# Patient Record
Sex: Female | Born: 1990
Health system: Southern US, Community
[De-identification: ages and names within clinical notes are randomized; demographics above are authoritative.]

## PROBLEM LIST (undated history)

## (undated) DIAGNOSIS — E785 Hyperlipidemia, unspecified: Secondary | ICD-10-CM

## (undated) DIAGNOSIS — I10 Essential (primary) hypertension: Secondary | ICD-10-CM

## (undated) DIAGNOSIS — N760 Acute vaginitis: Secondary | ICD-10-CM

## (undated) DIAGNOSIS — Z3403 Encounter for supervision of normal first pregnancy, third trimester: Secondary | ICD-10-CM

## (undated) DIAGNOSIS — T7840XA Allergy, unspecified, initial encounter: Secondary | ICD-10-CM

## (undated) DIAGNOSIS — O24419 Gestational diabetes mellitus in pregnancy, unspecified control: Secondary | ICD-10-CM

## (undated) DIAGNOSIS — E349 Endocrine disorder, unspecified: Secondary | ICD-10-CM

## (undated) DIAGNOSIS — B9689 Other specified bacterial agents as the cause of diseases classified elsewhere: Secondary | ICD-10-CM

## (undated) DIAGNOSIS — B999 Unspecified infectious disease: Secondary | ICD-10-CM

## (undated) HISTORY — DX: Other specified bacterial agents as the cause of diseases classified elsewhere: B96.89

## (undated) HISTORY — DX: Allergy, unspecified, initial encounter: T78.40XA

## (undated) HISTORY — DX: Other specified bacterial agents as the cause of diseases classified elsewhere: N76.0

## (undated) HISTORY — DX: Hyperlipidemia, unspecified: E78.5

## (undated) HISTORY — DX: Essential (primary) hypertension: I10

## (undated) HISTORY — DX: Unspecified infectious disease: B99.9

## (undated) HISTORY — DX: Gestational diabetes mellitus in pregnancy, unspecified control: O24.419

## (undated) HISTORY — PX: TUBAL LIGATION: SHX77

## (undated) HISTORY — PX: NO PAST SURGERIES: SHX2092

---

## 2013-07-29 NOTE — L&D Delivery Note (Signed)
Delivery Note At 11:01 PM a viable and healthy female was delivered via Vaginal, Spontaneous Delivery (Presentation: OA, LOT  ).  APGAR: 4,P ; weight  P.   Placenta status: delivered, intact .  Cord: 3VC  with the following complications: none.   Maternal fever in labor, treated with ampicillin ans gentamicin.  NICU called for code apgar, responded very quickly - baby to NICU for antibiotics.  Anesthesia:  epidural Episiotomy:  none Lacerations:  2nd degree perineal Suture Repair: 3.0 vicryl rapide Est. Blood Loss (mL): 400cc   Mom to postpartum.  Baby to Couplet care / Skin to Skin.  Edwards, Gabrielle Westra 07/17/2014, 11:27 PM  O+/RI/Tdap in PNC/Br/ contra?

## 2014-03-31 LAB — OB RESULTS CONSOLE ABO/RH: RH Type: POSITIVE

## 2014-03-31 LAB — OB RESULTS CONSOLE RPR: RPR: NONREACTIVE

## 2014-03-31 LAB — OB RESULTS CONSOLE HEPATITIS B SURFACE ANTIGEN: Hepatitis B Surface Ag: NEGATIVE

## 2014-03-31 LAB — OB RESULTS CONSOLE RUBELLA ANTIBODY, IGM: Rubella: IMMUNE

## 2014-03-31 LAB — OB RESULTS CONSOLE ANTIBODY SCREEN: Antibody Screen: NEGATIVE

## 2014-03-31 LAB — OB RESULTS CONSOLE HIV ANTIBODY (ROUTINE TESTING): HIV: NONREACTIVE

## 2014-06-15 LAB — OB RESULTS CONSOLE GC/CHLAMYDIA
Chlamydia: NEGATIVE
GC PROBE AMP, GENITAL: NEGATIVE

## 2014-06-15 LAB — OB RESULTS CONSOLE GBS: GBS: NEGATIVE

## 2014-07-16 ENCOUNTER — Encounter (HOSPITAL_COMMUNITY): Payer: Self-pay

## 2014-07-16 ENCOUNTER — Inpatient Hospital Stay (HOSPITAL_COMMUNITY)
Admission: RE | Admit: 2014-07-16 | Discharge: 2014-07-19 | DRG: 775 | Disposition: A | Discharge status: Home / Self Care | Payer: 59 | Source: Ambulatory Visit | Attending: Obstetrics and Gynecology | Admitting: Obstetrics and Gynecology

## 2014-07-16 VITALS — BP 131/87 | HR 81 | Temp 98.0°F | Resp 18 | Ht 60.0 in | Wt 186.0 lb

## 2014-07-16 DIAGNOSIS — O411230 Chorioamnionitis, third trimester, not applicable or unspecified: Principal | ICD-10-CM | POA: Diagnosis present

## 2014-07-16 DIAGNOSIS — O480 Post-term pregnancy: Secondary | ICD-10-CM | POA: Diagnosis present

## 2014-07-16 DIAGNOSIS — Z3483 Encounter for supervision of other normal pregnancy, third trimester: Secondary | ICD-10-CM | POA: Diagnosis present

## 2014-07-16 DIAGNOSIS — Z8759 Personal history of other complications of pregnancy, childbirth and the puerperium: Secondary | ICD-10-CM

## 2014-07-16 DIAGNOSIS — Z3A40 40 weeks gestation of pregnancy: Secondary | ICD-10-CM | POA: Diagnosis present

## 2014-07-16 DIAGNOSIS — Z3403 Encounter for supervision of normal first pregnancy, third trimester: Secondary | ICD-10-CM

## 2014-07-16 HISTORY — DX: Encounter for supervision of normal first pregnancy, third trimester: Z34.03

## 2014-07-16 HISTORY — DX: Endocrine disorder, unspecified: E34.9

## 2014-07-16 LAB — TYPE AND SCREEN
ABO/RH(D): O POS
Antibody Screen: NEGATIVE

## 2014-07-16 LAB — CBC
HCT: 38.1 % (ref 36.0–46.0)
Hemoglobin: 12.8 g/dL (ref 12.0–15.0)
MCH: 29.3 pg (ref 26.0–34.0)
MCHC: 33.6 g/dL (ref 30.0–36.0)
MCV: 87.2 fL (ref 78.0–100.0)
Platelets: 139 10*3/uL — ABNORMAL LOW (ref 150–400)
RBC: 4.37 MIL/uL (ref 3.87–5.11)
RDW: 15.1 % (ref 11.5–15.5)
WBC: 9.6 10*3/uL (ref 4.0–10.5)

## 2014-07-16 MED ORDER — LACTATED RINGERS IV SOLN
INTRAVENOUS | Status: DC
  Administered 2014-07-16 – 2014-07-17 (×5): via INTRAVENOUS

## 2014-07-16 MED ORDER — TERBUTALINE SULFATE 1 MG/ML IJ SOLN: 0.2500 mg | Freq: Once | INTRAMUSCULAR | Status: AC | PRN

## 2014-07-16 MED ORDER — OXYTOCIN BOLUS FROM INFUSION: 500.0000 mL | INTRAVENOUS | Status: DC

## 2014-07-16 MED ORDER — OXYCODONE-ACETAMINOPHEN 5-325 MG PO TABS: 2.0000 | ORAL_TABLET | ORAL | Status: DC | PRN

## 2014-07-16 MED ORDER — MISOPROSTOL 25 MCG QUARTER TABLET
25.0000 ug | ORAL_TABLET | ORAL | Status: DC | PRN
  Administered 2014-07-16: 25 ug via VAGINAL
  Filled 2014-07-16 (×3): qty 0.25
  Filled 2014-07-16: qty 1
  Filled 2014-07-16: qty 0.25

## 2014-07-16 MED ORDER — MISOPROSTOL 50MCG HALF TABLET
50.0000 ug | ORAL_TABLET | Freq: Four times a day (QID) | ORAL | Status: DC
  Administered 2014-07-17 (×2): 50 ug via ORAL
  Filled 2014-07-16 (×7): qty 1

## 2014-07-16 MED ORDER — LIDOCAINE HCL (PF) 1 % IJ SOLN
30.0000 mL | INTRAMUSCULAR | Status: DC | PRN
  Filled 2014-07-16: qty 30

## 2014-07-16 MED ORDER — OXYTOCIN 40 UNITS IN LACTATED RINGERS INFUSION - SIMPLE MED
1.0000 m[IU]/min | INTRAVENOUS | Status: DC
  Administered 2014-07-17: 6 m[IU]/min via INTRAVENOUS
  Administered 2014-07-17: 2 m[IU]/min via INTRAVENOUS
  Filled 2014-07-16: qty 1000

## 2014-07-16 MED ORDER — ONDANSETRON HCL 4 MG/2ML IJ SOLN: 4.0000 mg | Freq: Four times a day (QID) | INTRAMUSCULAR | Status: DC | PRN

## 2014-07-16 MED ORDER — OXYCODONE-ACETAMINOPHEN 5-325 MG PO TABS: 1.0000 | ORAL_TABLET | ORAL | Status: DC | PRN

## 2014-07-16 MED ORDER — CITRIC ACID-SODIUM CITRATE 334-500 MG/5ML PO SOLN: 30.0000 mL | ORAL | Status: DC | PRN

## 2014-07-16 MED ORDER — OXYTOCIN 40 UNITS IN LACTATED RINGERS INFUSION - SIMPLE MED: 62.5000 mL/h | INTRAVENOUS | Status: DC

## 2014-07-16 MED ORDER — LACTATED RINGERS IV SOLN
500.0000 mL | INTRAVENOUS | Status: DC | PRN
  Administered 2014-07-17: 1000 mL via INTRAVENOUS

## 2014-07-16 MED ORDER — ACETAMINOPHEN 325 MG PO TABS
650.0000 mg | ORAL_TABLET | ORAL | Status: DC | PRN
  Administered 2014-07-17: 650 mg via ORAL
  Filled 2014-07-16: qty 2

## 2014-07-16 NOTE — H&P (Signed)
Gabrielle Edwards is a 23 y.o. female G1P0 late entry to care, at 6340+ for IOL.  +FM, no LOF, no VB, occ ctx.  Relatively uncomplicated PNC, except late entry to care.  Chl in pregnancy, TOC neg.  Abnormal pap smear - will follow up in one year.    Maternal Medical History:  Contractions: Frequency: irregular.    Fetal activity: Perceived fetal activity is normal.    Prenatal Complications - Diabetes: none.    OB History    No data available    G1 present - late entry to care + abn pap, ASCUS HR HPV Chl in pregnancy  Past Medical History  Diagnosis Date  . Normal first pregnancy confirmed, currently in third trimester 07/16/2014   No past surgical history on file. 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   HENT: Negative.   Eyes: Negative.   Respiratory: Negative.   Cardiovascular: Negative.   Gastrointestinal: Negative.   Genitourinary: Negative.   Musculoskeletal: Negative.   Skin: Negative.   Neurological: Negative.   Psychiatric/Behavioral: Negative.       There were no vitals taken for this visit. Maternal Exam:  Abdomen: Fundal height is appropriate for gestation.   Estimated fetal weight is 7#.   Fetal presentation: vertex  Introitus: Normal vulva. Normal vagina.  Cervix: Cervix evaluated by digital exam.     Physical Exam  Constitutional: She is oriented to person, place, and time. She appears well-developed and well-nourished.  HENT:  Head: Normocephalic and atraumatic.   Cardiovascular: Normal rate and regular rhythm.   Respiratory: Effort normal and breath sounds normal. No respiratory distress. She has no wheezes.  GI: Soft. Bowel sounds are normal. She exhibits no distension. There is no tenderness.  Musculoskeletal: Normal range of motion.  Neurological: She is alert and oriented to person, place, and time.  Skin: Skin is warm and dry.  Psychiatric: She has a normal mood and affect. Her behavior is normal.    Prenatal labs: ABO, Rh:  O+ Antibody:  neg Rubella:  immune RPR:   NR HBsAg:   neg HIV:   neg GBS:   neg  Hgb 10.7/Plt 255K/ glucola 139, 3hr GTT 1 elevated, repeat 3hr WNL/Chl + TOC neg LMP cw 2nd tri US - cwd, nl anat, post plac, female   Tdap 04/14/14  Assessment/Plan: 23yo G1P0 at 40+for IOL cytotec O/N, then pitocin  gbbs neg Epidural prn   Gabrielle Edwards, Gabrielle Edwards 07/16/2014, 4:26 PM

## 2014-07-17 ENCOUNTER — Inpatient Hospital Stay (HOSPITAL_COMMUNITY): Payer: 59 | Admitting: Anesthesiology

## 2014-07-17 ENCOUNTER — Encounter (HOSPITAL_COMMUNITY): Payer: Self-pay

## 2014-07-17 LAB — CBC
HCT: 40 % (ref 36.0–46.0)
Hemoglobin: 13.7 g/dL (ref 12.0–15.0)
MCH: 30 pg (ref 26.0–34.0)
MCHC: 34.3 g/dL (ref 30.0–36.0)
MCV: 87.5 fL (ref 78.0–100.0)
PLATELETS: 132 10*3/uL — AB (ref 150–400)
RBC: 4.57 MIL/uL (ref 3.87–5.11)
RDW: 15.1 % (ref 11.5–15.5)
WBC: 13.2 10*3/uL — ABNORMAL HIGH (ref 4.0–10.5)

## 2014-07-17 LAB — ABO/RH: ABO/RH(D): O POS

## 2014-07-17 LAB — RPR

## 2014-07-17 MED ORDER — FENTANYL 2.5 MCG/ML BUPIVACAINE 1/10 % EPIDURAL INFUSION (WH - ANES)
14.0000 mL/h | INTRAMUSCULAR | Status: DC | PRN
  Administered 2014-07-17 (×2): 14 mL/h via EPIDURAL
  Filled 2014-07-17 (×2): qty 125

## 2014-07-17 MED ORDER — PROMETHAZINE HCL 25 MG/ML IJ SOLN
25.0000 mg | Freq: Four times a day (QID) | INTRAMUSCULAR | Status: DC | PRN
  Administered 2014-07-17: 25 mg via INTRAVENOUS
  Filled 2014-07-17: qty 1

## 2014-07-17 MED ORDER — LIDOCAINE HCL (PF) 1 % IJ SOLN
INTRAMUSCULAR | Status: DC | PRN
  Administered 2014-07-17 (×2): 5 mL

## 2014-07-17 MED ORDER — EPHEDRINE 5 MG/ML INJ
10.0000 mg | INTRAVENOUS | Status: DC | PRN
  Filled 2014-07-17: qty 2

## 2014-07-17 MED ORDER — DIPHENHYDRAMINE HCL 50 MG/ML IJ SOLN: 12.5000 mg | INTRAMUSCULAR | Status: DC | PRN

## 2014-07-17 MED ORDER — DEXTROSE 5 % IV SOLN
120.0000 mg | Freq: Three times a day (TID) | INTRAVENOUS | Status: DC
  Administered 2014-07-17: 120 mg via INTRAVENOUS
  Filled 2014-07-17 (×3): qty 3

## 2014-07-17 MED ORDER — BUTORPHANOL TARTRATE 1 MG/ML IJ SOLN
2.0000 mg | INTRAMUSCULAR | Status: DC | PRN
  Administered 2014-07-17 (×3): 2 mg via INTRAVENOUS
  Filled 2014-07-17 (×3): qty 2

## 2014-07-17 MED ORDER — SODIUM CHLORIDE 0.9 % IV SOLN
2.0000 g | Freq: Four times a day (QID) | INTRAVENOUS | Status: DC
  Administered 2014-07-17: 2 g via INTRAVENOUS
  Filled 2014-07-17 (×4): qty 2000

## 2014-07-17 MED ORDER — PHENYLEPHRINE 40 MCG/ML (10ML) SYRINGE FOR IV PUSH (FOR BLOOD PRESSURE SUPPORT)
80.0000 ug | PREFILLED_SYRINGE | INTRAVENOUS | Status: DC | PRN
  Filled 2014-07-17: qty 10
  Filled 2014-07-17: qty 2

## 2014-07-17 MED ORDER — PHENYLEPHRINE 40 MCG/ML (10ML) SYRINGE FOR IV PUSH (FOR BLOOD PRESSURE SUPPORT)
80.0000 ug | PREFILLED_SYRINGE | INTRAVENOUS | Status: DC | PRN
  Filled 2014-07-17: qty 2

## 2014-07-17 MED ORDER — LACTATED RINGERS IV SOLN: 500.0000 mL | Freq: Once | INTRAVENOUS | Status: DC

## 2014-07-17 NOTE — Anesthesia Procedure Notes (Signed)
Epidural Patient location during procedure: OB Start time: 07/17/2014 11:37 AM  Staffing Anesthesiologist: Brayton CavesJACKSON, Banyan Goodchild Performed by: anesthesiologist   Preanesthetic Checklist Completed: patient identified, site marked, surgical consent, pre-op evaluation, timeout performed, IV checked, risks and benefits discussed and monitors and equipment checked  Epidural Patient position: sitting Prep: site prepped and draped and DuraPrep Patient monitoring: continuous pulse ox and blood pressure Approach: midline Location: L3-L4 Injection technique: LOR air  Needle:  Needle type: Tuohy  Needle gauge: 17 G Needle length: 9 cm and 9 Needle insertion depth: 7 cm Catheter type: closed end flexible Catheter size: 19 Gauge Catheter at skin depth: 12 cm Test dose: negative  Assessment Events: blood not aspirated, injection not painful, no injection resistance, negative IV test and no paresthesia  Additional Notes Patient identified.  Risk benefits discussed including failed block, incomplete pain control, headache, nerve damage, paralysis, blood pressure changes, nausea, vomiting, reactions to medication both toxic or allergic, and postpartum back pain.  Patient expressed understanding and wished to proceed.  All questions were answered.  Sterile technique used throughout procedure and epidural site dressed with sterile barrier dressing. No paresthesia or other complications noted.The patient did not experience any signs of intravascular injection such as tinnitus or metallic taste in mouth nor signs of intrathecal spread such as rapid motor block. Please see nursing notes for vital signs.

## 2014-07-17 NOTE — Progress Notes (Signed)
Patient ID: Gabrielle Edwards, female   DOB: 1990/11/03, 23 y.o.   MRN: [id number]   Comfortable with epidural  AFVSS gen NAD FHTs 150's, good var, category 1 toco 1-3 min  7/90/0  Continue IOL - pitocin

## 2014-07-17 NOTE — Progress Notes (Signed)
Patient ID: Gabrielle Edwards, female   DOB: [birth year], 23 y.o.   MRN: [id number]  Comfortable with stadol.    AFVSS gen NAD FHTs 130's good var, category 1 toco q 1-4 min  4/90/0 Attempt at AROM, w/o diff/comp  Contibue IOL

## 2014-07-17 NOTE — Progress Notes (Signed)
ANTIBIOTIC CONSULT NOTE - INITIAL  Pharmacy Consult for Gentamicin Indication: Chorioamnionitis   No Known Allergies  Patient Measurements: Height: 5' (152.4 cm) Weight: 186 lb (84.369 kg) IBW/kg (Calculated) : 45.5 Adjusted Body Weight: 57.2 kg Dosing Weight: 57.2 kg  Vital Signs: Temp: 100.9 F (38.3 C) (12/20 1902) Temp Source: Axillary (12/20 1902) BP: 137/86 mmHg (12/20 1902) Pulse Rate: 89 (12/20 1902)  Labs:  Recent Labs  07/16/14 1925 07/17/14 0940  WBC 9.6 13.2*  HGB 12.8 13.7  PLT 139* 132*   No results for input(s): GENTTROUGH, GENTPEAK, GENTRANDOM in the last 72 hours.   Microbiology: No results found for this or any previous visit (from the past 720 hour(s)).  Medications:  Ampicillin 2 gm IV Q6H  Assessment: 23 y.o. female G2P0 at 6177w5d with onset fever initiated on ampicillin and gentamicin Estimated Ke = 0.269, Vd = 20 L  Goal of Therapy:  Gentamicin peak 6-8 mg/L and Trough < 1 mg/L  Plan:   Gentamicin 120 mg IV every 8 hrs  Check Scr with next labs if gentamicin continued. Will check gentamicin levels if continued > 72hr or clinically indicated.  Thank you for consulting pharmacy, Alphonso Gregson P 07/17/2014,7:50 PM

## 2014-07-17 NOTE — Progress Notes (Signed)
Patient ID: Gabrielle Edwards, female   DOB: [birth year], 23 y.o.   MRN: [id number]  Comfortable with contractions  AFVSS gen NAD FHTs 140's, good var, category 1 toco q 2min  SVE 4.5/90/0  IUPC placed w/o diff/comp  Continue current mgmt Poss IOL with pitocin

## 2014-07-17 NOTE — Anesthesia Preprocedure Evaluation (Signed)

## 2014-07-18 LAB — CBC
HCT: 32.6 % — ABNORMAL LOW (ref 36.0–46.0)
HEMOGLOBIN: 11.3 g/dL — AB (ref 12.0–15.0)
MCH: 30.5 pg (ref 26.0–34.0)
MCHC: 34.7 g/dL (ref 30.0–36.0)
MCV: 88.1 fL (ref 78.0–100.0)
Platelets: 115 10*3/uL — ABNORMAL LOW (ref 150–400)
RBC: 3.7 MIL/uL — ABNORMAL LOW (ref 3.87–5.11)
RDW: 15.2 % (ref 11.5–15.5)
WBC: 18.5 10*3/uL — ABNORMAL HIGH (ref 4.0–10.5)

## 2014-07-18 MED ORDER — INFLUENZA VAC SPLIT QUAD 0.5 ML IM SUSY
0.5000 mL | PREFILLED_SYRINGE | INTRAMUSCULAR | Status: AC
  Administered 2014-07-19: 0.5 mL via INTRAMUSCULAR
  Filled 2014-07-18 (×2): qty 0.5

## 2014-07-18 MED ORDER — ZOLPIDEM TARTRATE 5 MG PO TABS: 5.0000 mg | ORAL_TABLET | Freq: Every evening | ORAL | Status: DC | PRN

## 2014-07-18 MED ORDER — LANOLIN HYDROUS EX OINT: TOPICAL_OINTMENT | CUTANEOUS | Status: DC | PRN

## 2014-07-18 MED ORDER — OXYCODONE-ACETAMINOPHEN 5-325 MG PO TABS: 1.0000 | ORAL_TABLET | ORAL | Status: DC | PRN

## 2014-07-18 MED ORDER — ONDANSETRON HCL 4 MG/2ML IJ SOLN: 4.0000 mg | INTRAMUSCULAR | Status: DC | PRN

## 2014-07-18 MED ORDER — PRENATAL MULTIVITAMIN CH
1.0000 | ORAL_TABLET | Freq: Every day | ORAL | Status: DC
  Administered 2014-07-18: 1 via ORAL
  Filled 2014-07-18: qty 1

## 2014-07-18 MED ORDER — DIBUCAINE 1 % RE OINT: 1.0000 "application " | TOPICAL_OINTMENT | RECTAL | Status: DC | PRN

## 2014-07-18 MED ORDER — IBUPROFEN 600 MG PO TABS
600.0000 mg | ORAL_TABLET | Freq: Four times a day (QID) | ORAL | Status: DC
  Administered 2014-07-18 – 2014-07-19 (×6): 600 mg via ORAL
  Filled 2014-07-18 (×6): qty 1

## 2014-07-18 MED ORDER — LACTATED RINGERS IV SOLN: INTRAVENOUS | Status: DC

## 2014-07-18 MED ORDER — OXYCODONE-ACETAMINOPHEN 5-325 MG PO TABS: 2.0000 | ORAL_TABLET | ORAL | Status: DC | PRN

## 2014-07-18 MED ORDER — ONDANSETRON HCL 4 MG PO TABS: 4.0000 mg | ORAL_TABLET | ORAL | Status: DC | PRN

## 2014-07-18 MED ORDER — WITCH HAZEL-GLYCERIN EX PADS: 1.0000 "application " | MEDICATED_PAD | CUTANEOUS | Status: DC | PRN

## 2014-07-18 MED ORDER — DIPHENHYDRAMINE HCL 25 MG PO CAPS: 25.0000 mg | ORAL_CAPSULE | Freq: Four times a day (QID) | ORAL | Status: DC | PRN

## 2014-07-18 MED ORDER — SIMETHICONE 80 MG PO CHEW: 80.0000 mg | CHEWABLE_TABLET | ORAL | Status: DC | PRN

## 2014-07-18 MED ORDER — SENNOSIDES-DOCUSATE SODIUM 8.6-50 MG PO TABS
2.0000 | ORAL_TABLET | ORAL | Status: DC
  Administered 2014-07-19: 2 via ORAL
  Filled 2014-07-18: qty 2

## 2014-07-18 MED ORDER — BENZOCAINE-MENTHOL 20-0.5 % EX AERO
1.0000 "application " | INHALATION_SPRAY | CUTANEOUS | Status: DC | PRN
  Administered 2014-07-18: 1 via TOPICAL
  Filled 2014-07-18: qty 56

## 2014-07-18 NOTE — Lactation Note (Signed)
This note was copied from the chart of Gabrielle Tirzah Gomes. Lactation Consultation Note       Initial consult with this mom of a term NICU baby, now 5212 hours old. The baby had perinatal depression, but is now in room air,, on antibiotics for r/o infection. Mom had maternal temp, suspected chorio, and was treated with antibiotics. Mom is pumping every 3 hours, and expressing about 0.5 ml's of colostrum. I showed mom how to hand express, and she demonstrated with good technique. Mom has a DEP at home, but not sure of the brand. Mom is aware she can do a 2 week rental, and call her insurance for a pump. Mom very receptive to teaching, lactation serives also reviewed with mom. She knows to call for lactation as needed. Patient Name: Gabrielle Edwards [id number]'VToday's Date: 07/18/2014 Reason for consult: Initial assessment;NICU baby   Maternal Data Formula Feeding for Exclusion: Yes (Baby in NICU) Has patient been taught Hand Expression?: Yes Does the patient have breastfeeding experience prior to this delivery?: No  Feeding    LATCH Score/Interventions                      Lactation Tools Discussed/Used WIC Program: No Pump Review: Setup, frequency, and cleaning;Milk Storage;Other (comment) (hand expression, review of NICU booklet, ppremie setting) Initiated by:: bedisde RN at 7 hours pp Date initiated:: 07/18/14   Consult Status Consult Status: Follow-up Date: 07/19/14 Follow-up type: In-patient    Gabrielle Edwards, Gabrielle Edwards 07/18/2014, 11:52 AM

## 2014-07-18 NOTE — Progress Notes (Signed)
Post Partum Day 1 Subjective: no complaints, up ad lib, voiding, tolerating PO and nl lochia, pain controlled; baby in NICU on RA and abx   Objective: Blood pressure 116/64, pulse 110, temperature 99.4 F (37.4 C), temperature source Oral, resp. rate 18, height 5' (1.524 m), weight 84.369 kg (186 lb), last menstrual period 10/05/2013, SpO2 98 %, unknown if currently breastfeeding.  Physical Exam:  General: alert and no distress Lochia: appropriate Uterine Fundus: firm  Recent Labs  07/16/14 1925 07/17/14 0940  HGB 12.8 13.7  HCT 38.1 40.0    Assessment/Plan: Plan for discharge tomorrow, Breastfeeding and Lactation consult.  Baby in NICU.     LOS: 2 days   Edwards, Gabrielle Heyne 07/18/2014, 6:36 AM

## 2014-07-18 NOTE — Lactation Note (Signed)
This note was copied from the chart of Gabrielle Edwards. Lactation Consultation Note      Follow up consult with this mom and term NICU baby, now 3515 hours old, and breast feeding for the first time. I assisted mom with latching the baby in cross cradle hold. He latched and suckled a few times, and then unlatched. He di this for about 10 minutes, with about 5 minutes of sucking. I also syringe fed him 0.7 mls of EBM. Mom was thrilled when he began sucking - very happy. I told mom to change her pumping schedule to 9-12 .... , so she can pump after attempting to breast feed [name].   The baby was supplemented with 15 mls of formula after the breast feeding, by bottle. I told mom I would work with her and [name] again tomorrow.   Patient Name: Gabrielle Gabrielle Edwards ZOXWR'UToday's Date: 07/18/2014 Reason for consult: Initial assessment;NICU baby   Maternal Data Formula Feeding for Exclusion: Yes (Baby in NICU) Has patient been taught Hand Expression?: Yes Does the patient have breastfeeding experience prior to this delivery?: No  Feeding    LATCH Score/Interventions                      Lactation Tools Discussed/Used WIC Program: No Pump Review: Setup, frequency, and cleaning;Milk Storage;Other (comment) (hand expression, review of NICU booklet, ppremie setting) Initiated by:: bedisde RN at 7 hours pp Date initiated:: 07/18/14   Consult Status Consult Status: Follow-up Date: 07/19/14 Follow-up type: In-patient    Alfred LevinsLee, Kristianne Albin Anne 07/18/2014, 2:42 PM

## 2014-07-18 NOTE — Anesthesia Postprocedure Evaluation (Signed)
Anesthesia Post Note  Patient: Gabrielle Edwards  Procedure(s) Performed: * No procedures listed *  Anesthesia type: Epidural  Patient location: Women's unit  Post pain: Pain level controlled  Post assessment: Post-op Vital signs reviewed  Last Vitals:  Filed Vitals:   07/18/14 0616  BP: 116/64  Pulse: 110  Temp: 37.4 C  Resp: 18    Post vital signs: Reviewed  Level of consciousness:alert  Complications: No apparent anesthesia complications

## 2014-07-19 MED ORDER — IBUPROFEN 800 MG PO TABS: 800.0000 mg | ORAL_TABLET | Freq: Three times a day (TID) | ORAL | Status: DC | PRN

## 2014-07-19 MED ORDER — OXYCODONE-ACETAMINOPHEN 5-325 MG PO TABS: 1.0000 | ORAL_TABLET | Freq: Four times a day (QID) | ORAL | Status: DC | PRN

## 2014-07-19 MED ORDER — PRENATAL MULTIVITAMIN CH: 1.0000 | ORAL_TABLET | Freq: Every day | ORAL | Status: DC

## 2014-07-19 NOTE — Progress Notes (Signed)
Pt discharged home with boyfriend.. Discharge instructions reviewed with pt and she verbalized understanding... Condition stable... No equipment... Ambulated to car with Selinda MichaelsE. Nikkol Pai, RN.

## 2014-07-19 NOTE — Discharge Summary (Signed)
Obstetric Discharge Summary Reason for Admission: induction of labor Prenatal Procedures: none Intrapartum Procedures: spontaneous vaginal delivery Postpartum Procedures: none Complications-Operative and Postpartum: 2nd  degree perineal laceration HEMOGLOBIN  Date Value Ref Range Status  07/18/2014 11.3* 12.0 - 15.0 g/dL Final    Comment:    REPEATED TO VERIFY DELTA CHECK NOTED    HCT  Date Value Ref Range Status  07/18/2014 32.6* 36.0 - 46.0 % Final    Physical Exam:  General: alert and no distress Lochia: appropriate Uterine Fundus: firm  Discharge Diagnoses: Term Pregnancy-delivered  Discharge Information: Date: 07/19/2014 Activity: pelvic rest Diet: routine Medications: PNV, Ibuprofen and Percocet Condition: stable Instructions: refer to practice specific booklet Discharge to: home Follow-up Information    Follow up with Bovard-Stuckert, Sesar Madewell, MD. Schedule an appointment as soon as possible for a visit in 6 weeks.   Specialty:  Obstetrics and Gynecology   Why:  for postpartum check   Contact information:   [address] [place] [address] [phone]       Newborn Data: Live born female  Birth Weight: 7 lb 7.6 oz (3391 g) APGAR: 4, 5  Baby in NICU - IVF and antibiotics  Bovard-Stuckert, Cellie Dardis 07/19/2014, 8:38 AM

## 2014-07-19 NOTE — Progress Notes (Signed)
CLINICAL SOCIAL WORK  BRIEF PSYCHOSOCIAL ASSESSMENT  Referred by: No referral-NICU admission    On:    For:      Patient Interview Family Interview___X___  Other:   PSYCHOSOCIAL DATA:   Lives Alone  Lives with: ___boyfried___  Primary Support (Name/Relationship): Jordan Allen Degree of support available: Good supports-MGM and MSGF here with parents today   CURRENT CONCERNS:     _X_None noted Substance Abuse     Behavioral Health Issues    Financial Resources     Abuse/Neglect/Domestic Violence   Cultural/Religious Issues     Post-Acute Placement    Adjustment to Illness     Knowledge/Cognitive Deficit      Other:     SOCIAL WORK ASSESSMENT/PLAN:  CSW met with parents in MOB's third floor room to complete assessment due to NICU admission.  CSW offered support and discussed common emotions related to discharging with a baby remaining in the hospital.  CSW discussed PPD signs and symptoms and when to contact a medical professional for assistance.  CSW gave contact information and explained ongoing support services offered by NICU CSW.  CSW has no concerns at this time and identifies no barriers to discharge when baby is medically ready.   No Further Intervention Required  Psychosocial Support/Ongoing Assessment of Needs__X__ Information/Referral to Community Resources Other  PATIENT'S/FAMILY'S RESPONSE TO PLAN OF CARE:  Parents appear to be coping well and understanding of the situation.  Parents state that they are in a relationship and appear supportive of each other.  They report that they have everything they need for baby at home except the breast pump from MOB's insurance company.  CSW suggested MOB talk with lactation about renting a pump until she obtains her own if needed.  Parents report a good understanding of baby's medical condition and state no barriers to visiting once MOB is discharged today.  They acknowledge feelings of sadness regarding having to leave the hospital  without him today, but state "we're ok as long as he is healthy."  They report no questions, concerns or needs for CSW and thanked CSW for the visit. 

## 2014-07-19 NOTE — Progress Notes (Signed)
Post Partum Day 2 Subjective: no complaints, up ad lib, voiding, tolerating PO and nl lochia, pain controlled.  baby in NICU doing well - IVF and antibiotics  Objective: Blood pressure 129/70, pulse 86, temperature 97.8 F (36.6 C), temperature source Oral, resp. rate 18, height 5' (1.524 m), weight 84.369 kg (186 lb), last menstrual period 10/05/2013, SpO2 99 %, unknown if currently breastfeeding.  Physical Exam:  General: alert and no distress Lochia: appropriate Uterine Fundus: firm   Recent Labs  07/17/14 0940 07/18/14 0535  HGB 13.7 11.3*  HCT 40.0 32.6*    Assessment/Plan: Discharge home, Breastfeeding and Lactation consult.  Routine care/instructions. D/C with motrin, percocet and PNV.  F/u 6 weeks   LOS: 3 days   Bovard-Stuckert, Khasir Woodrome 07/19/2014, 8:05 AM

## 2014-07-20 ENCOUNTER — Ambulatory Visit: Payer: Self-pay

## 2014-07-20 NOTE — Lactation Note (Signed)
This note was copied from the chart of Gabrielle Edwards. Lactation Consultation Note  Follow up visit in NICU.  Mom states she is pumping but not obtaining milk.  Reassured and encouraged to continue pumping every 3 hours and milk volume should increase in next 24-48 hours.  Patient Name: Gabrielle Beckey Downingvy Steinmetz HQION'GToday's Date: 07/20/2014     Maternal Data    Feeding Feeding Type: Formula Nipple Type: Slow - flow Length of feed: 30 min  LATCH Score/Interventions                      Lactation Tools Discussed/Used     Consult Status      Huston FoleyMOULDEN, Haiven Nardone S 07/20/2014, 1:27 PM

## 2014-08-13 ENCOUNTER — Inpatient Hospital Stay (HOSPITAL_COMMUNITY): Admission: AD | Admit: 2014-08-13 | Payer: Self-pay | Source: Ambulatory Visit | Admitting: Obstetrics

## 2015-10-12 ENCOUNTER — Ambulatory Visit (INDEPENDENT_AMBULATORY_CARE_PROVIDER_SITE_OTHER): Payer: Self-pay | Admitting: *Deleted

## 2015-10-12 ENCOUNTER — Encounter: Payer: Self-pay | Admitting: Family Medicine

## 2015-10-12 DIAGNOSIS — Z3201 Encounter for pregnancy test, result positive: Secondary | ICD-10-CM

## 2015-10-12 LAB — POCT PREGNANCY, URINE: Preg Test, Ur: POSITIVE — AB

## 2015-11-09 LAB — OB RESULTS CONSOLE HGB/HCT, BLOOD
HCT: 35 %
Hemoglobin: 11.6 g/dL

## 2015-11-09 LAB — OB RESULTS CONSOLE ABO/RH: RH TYPE: POSITIVE

## 2015-11-09 LAB — OB RESULTS CONSOLE GC/CHLAMYDIA
CHLAMYDIA, DNA PROBE: NEGATIVE
Gonorrhea: NEGATIVE

## 2015-11-09 LAB — OB RESULTS CONSOLE VARICELLA ZOSTER ANTIBODY, IGG: VARICELLA IGG: IMMUNE

## 2015-11-09 LAB — OB RESULTS CONSOLE RUBELLA ANTIBODY, IGM: RUBELLA: IMMUNE

## 2015-11-09 LAB — OB RESULTS CONSOLE ANTIBODY SCREEN: Antibody Screen: NEGATIVE

## 2015-11-09 LAB — OB RESULTS CONSOLE HEPATITIS B SURFACE ANTIGEN: HEP B S AG: NEGATIVE

## 2015-11-09 LAB — OB RESULTS CONSOLE RPR: RPR: NONREACTIVE

## 2015-11-09 LAB — OB RESULTS CONSOLE HIV ANTIBODY (ROUTINE TESTING): HIV: NONREACTIVE

## 2015-11-09 LAB — OB RESULTS CONSOLE PLATELET COUNT: Platelets: 217 10*3/uL

## 2015-11-20 ENCOUNTER — Ambulatory Visit: Payer: Self-pay

## 2015-11-22 ENCOUNTER — Encounter: Payer: Self-pay | Admitting: *Deleted

## 2015-11-22 DIAGNOSIS — O24419 Gestational diabetes mellitus in pregnancy, unspecified control: Secondary | ICD-10-CM

## 2015-11-22 DIAGNOSIS — O0990 Supervision of high risk pregnancy, unspecified, unspecified trimester: Secondary | ICD-10-CM

## 2015-11-24 ENCOUNTER — Encounter: Payer: Self-pay | Admitting: *Deleted

## 2015-11-27 ENCOUNTER — Encounter: Payer: Medicaid Other | Attending: Family Medicine | Admitting: Dietician

## 2015-11-27 ENCOUNTER — Ambulatory Visit (INDEPENDENT_AMBULATORY_CARE_PROVIDER_SITE_OTHER): Payer: Medicaid Other | Admitting: Advanced Practice Midwife

## 2015-11-27 ENCOUNTER — Encounter: Payer: Self-pay | Admitting: Advanced Practice Midwife

## 2015-11-27 VITALS — BP 113/74 | HR 93 | Temp 98.4°F | Wt 203.9 lb

## 2015-11-27 DIAGNOSIS — Z029 Encounter for administrative examinations, unspecified: Secondary | ICD-10-CM | POA: Diagnosis present

## 2015-11-27 DIAGNOSIS — O0993 Supervision of high risk pregnancy, unspecified, third trimester: Secondary | ICD-10-CM

## 2015-11-27 DIAGNOSIS — O24419 Gestational diabetes mellitus in pregnancy, unspecified control: Secondary | ICD-10-CM | POA: Diagnosis not present

## 2015-11-27 LAB — POCT URINALYSIS DIP (DEVICE)
BILIRUBIN URINE: NEGATIVE
GLUCOSE, UA: 250 mg/dL — AB
Hgb urine dipstick: NEGATIVE
Ketones, ur: NEGATIVE mg/dL
LEUKOCYTES UA: NEGATIVE
NITRITE: NEGATIVE
Protein, ur: 30 mg/dL — AB
UROBILINOGEN UA: 0.2 mg/dL (ref 0.0–1.0)
pH: 6.5 (ref 5.0–8.0)

## 2015-11-27 NOTE — Patient Instructions (Signed)
Gestational Diabetes Mellitus  Gestational diabetes mellitus, often simply referred to as gestational diabetes, is a type of diabetes that some women develop during pregnancy. In gestational diabetes, the pancreas does not make enough insulin (a hormone), the cells are less responsive to the insulin that is made (insulin resistance), or both. Normally, insulin moves sugars from food into the tissue cells. The tissue cells use the sugars for energy. The lack of insulin or the lack of normal response to insulin causes excess sugars to build up in the blood instead of going into the tissue cells. As a result, high blood sugar (hyperglycemia) develops. The effect of high sugar (glucose) levels can cause many problems.   RISK FACTORS  You have an increased chance of developing gestational diabetes if you have a family history of diabetes and also have one or more of the following risk factors:  · A body mass index over 30 (obesity).  · A previous pregnancy with gestational diabetes.  · An older age at the time of pregnancy.  If blood glucose levels are kept in the normal range during pregnancy, women can have a healthy pregnancy. If your blood glucose levels are not well controlled, there may be risks to you, your unborn baby (fetus), your labor and delivery, or your newborn baby.   SYMPTOMS   If symptoms are experienced, they are much like symptoms you would normally expect during pregnancy. The symptoms of gestational diabetes include:   · Increased thirst (polydipsia).  · Increased urination (polyuria).  · Increased urination during the night (nocturia).  · Weight loss. This weight loss may be rapid.  · Frequent, recurring infections.  · Tiredness (fatigue).  · Weakness.  · Vision changes, such as blurred vision.  · Fruity smell to your breath.  · Abdominal pain.  DIAGNOSIS  Diabetes is diagnosed when blood glucose levels are increased. Your blood glucose level may be checked by one or more of the following blood  tests:  · A fasting blood glucose test. You will not be allowed to eat for at least 8 hours before a blood sample is taken.  · A random blood glucose test. Your blood glucose is checked at any time of the day regardless of when you ate.  · An oral glucose tolerance test (OGTT). Your blood glucose is measured after you have not eaten (fasted) for 1-3 hours and then after you drink a glucose-containing beverage. Since the hormones that cause insulin resistance are highest at about 24-28 weeks of a pregnancy, an OGTT is usually performed during that time. If you have risk factors, you may be screened for undiagnosed type 2 diabetes at your first prenatal visit.  TREATMENT   Gestational diabetes should be managed first with diet and exercise. Medicines may be added only if they are needed.  · You will need to take diabetes medicine or insulin daily to keep blood glucose levels in the desired range.  · You will need to match insulin dosing with exercise and healthy food choices.  If you have gestational diabetes, your treatment goal is to maintain the following blood glucose levels:  · Before meals (preprandial): at or below 95 mg/dL.  · After meals (postprandial):    One hour after a meal: at or below 140 mg/dL.    Two hours after a meal: at or below 120 mg/dL.  If you have pre-existing type 1 or type 2 diabetes, your treatment goal is to maintain the following blood glucose levels:  · Before   meals, at bedtime, and overnight: 60-99 mg/dL.  · After meals: peak of 100-129 mg/dL.  HOME CARE INSTRUCTIONS   · Have your hemoglobin A1c level checked twice a year.  · Perform daily blood glucose monitoring as directed by your health care provider. It is common to perform frequent blood glucose monitoring.  · Monitor urine ketones when you are ill and as directed by your health care provider.  · Take your diabetes medicine and insulin as directed by your health care provider to maintain your blood glucose level in the desired  range.  ¨ Never run out of diabetes medicine or insulin. It is needed every day.  ¨ Adjust insulin based on your intake of carbohydrates. Carbohydrates can raise blood glucose levels but need to be included in your diet. Carbohydrates provide vitamins, minerals, and fiber which are an essential part of a healthy diet. Carbohydrates are found in fruits, vegetables, whole grains, dairy products, legumes, and foods containing added sugars.  · Eat healthy foods. Alternate 3 meals with 3 snacks.  · Maintain a healthy weight gain. The usual total expected weight gain varies according to your prepregnancy body mass index (BMI).  · Carry a medical alert card or wear your medical alert jewelry.  · Carry a 15-gram carbohydrate snack with you at all times to treat low blood glucose (hypoglycemia). Some examples of 15-gram carbohydrate snacks include:  ¨ Glucose tablets, 3 or 4.  ¨ Glucose gel, 15-gram tube.  ¨ Raisins, 2 tablespoons (24 g).  ¨ Jelly beans, 6.  ¨ Animal crackers, 8.  ¨ Fruit juice, regular soda, or low-fat milk, 4 ounces (120 mL).  ¨ Gummy treats, 9.  · Recognize hypoglycemia. Hypoglycemia during pregnancy occurs with blood glucose levels of 60 mg/dL and below. The risk for hypoglycemia increases when fasting or skipping meals, during or after intense exercise, and during sleep. Hypoglycemia symptoms can include:  ¨ Tremors or shakes.  ¨ Decreased ability to concentrate.  ¨ Sweating.  ¨ Increased heart rate.  ¨ Headache.  ¨ Dry mouth.  ¨ Hunger.  ¨ Irritability.  ¨ Anxiety.  ¨ Restless sleep.  ¨ Altered speech or coordination.  ¨ Confusion.  · Treat hypoglycemia promptly. If you are alert and able to safely swallow, follow the 15:15 rule:  ¨ Take 15-20 grams of rapid-acting glucose or carbohydrate. Rapid-acting options include glucose gel, glucose tablets, or 4 ounces (120 mL) of fruit juice, regular soda, or low-fat milk.  ¨ Check your blood glucose level 15 minutes after taking the glucose.  ¨ Take 15-20  grams more of glucose if the repeat blood glucose level is still 70 mg/dL or below.  ¨ Eat a meal or snack within 1 hour once blood glucose levels return to normal.  · Be alert to polyuria (excess urination) and polydipsia (excess thirst) which are early signs of hyperglycemia. An early awareness of hyperglycemia allows for prompt treatment. Treat hyperglycemia as directed by your health care provider.  · Engage in at least 30 minutes of physical activity a day or as directed by your health care provider. Ten minutes of physical activity timed 30 minutes after each meal is encouraged to control postprandial blood glucose levels.  · Adjust your insulin dosing and food intake as needed if you start a new exercise or sport.  · Follow your sick-day plan at any time you are unable to eat or drink as usual.  · Avoid tobacco and alcohol use.  · Keep all follow-up visits as directed   by your health care provider.  · Follow the advice of your health care provider regarding your prenatal and post-delivery (postpartum) appointments, meal planning, exercise, medicines, vitamins, blood tests, other medical tests, and physical activities.  · Perform daily skin and foot care. Examine your skin and feet daily for cuts, bruises, redness, nail problems, bleeding, blisters, or sores.  · Brush your teeth and gums at least twice a day and floss at least once a day. Follow up with your dentist regularly.  · Schedule an eye exam during the first trimester of your pregnancy or as directed by your health care provider.  · Share your diabetes management plan with your workplace or school.  · Stay up-to-date with immunizations.  · Learn to manage stress.  · Obtain ongoing diabetes education and support as needed.  · Learn about and consider breastfeeding your baby.  · You should have your blood sugar level checked 6-12 weeks after delivery. This is done with an oral glucose tolerance test (OGTT).  SEEK MEDICAL CARE IF:   · You are unable to  eat food or drink fluids for more than 6 hours.  · You have nausea and vomiting for more than 6 hours.  · You have a blood glucose level of 200 mg/dL and you have ketones in your urine.  · There is a change in mental status.  · You develop vision problems.  · You have a persistent headache.  · You have upper abdominal pain or discomfort.  · You develop an additional serious illness.  · You have diarrhea for more than 6 hours.  · You have been sick or have had a fever for a couple of days and are not getting better.  SEEK IMMEDIATE MEDICAL CARE IF:   · You have difficulty breathing.  · You no longer feel the baby moving.  · You are bleeding or have discharge from your vagina.  · You start having premature contractions or labor.  MAKE SURE YOU:  · Understand these instructions.  · Will watch your condition.  · Will get help right away if you are not doing well or get worse.     This information is not intended to replace advice given to you by your health care provider. Make sure you discuss any questions you have with your health care provider.     Document Released: 10/21/2000 Document Revised: 08/05/2014 Document Reviewed: 02/11/2012  Elsevier Interactive Patient Education ©2016 [employer]

## 2015-11-27 NOTE — Progress Notes (Signed)
Breastfeeding tip of the week reviewed. 

## 2015-11-27 NOTE — Progress Notes (Signed)
Tubal consents signed and copy given to patient.

## 2015-11-27 NOTE — Progress Notes (Signed)
Nutrition Note: GDM Diet Education H/o obesity Pt has gained 38.9 #  at 4913w5d, which is > expected. Pt reports eating 2 meals and 2 snacks a day Pt is taking a PNV Pt reports no N/V or heartburn. NKFA. Pt received verbal and written education on GDM diet during pregancy. Pt agrees to continue to take PNV. Pt has WIC F/u in 4-6 weeks. Carloyn Mannerebekah Kristy Schomburg MS, RD ,LDN

## 2015-11-29 ENCOUNTER — Encounter: Payer: Self-pay | Admitting: *Deleted

## 2015-11-29 MED ORDER — GLUCOSE BLOOD VI STRP: ORAL_STRIP | Status: DC

## 2015-11-29 MED ORDER — ACCU-CHEK FASTCLIX LANCETS MISC: 1.0000 | Status: DC

## 2015-11-29 MED ORDER — ACCU-CHEK AVIVA PLUS W/DEVICE KIT: 1.0000 | PACK | Status: AC

## 2015-11-29 NOTE — Progress Notes (Signed)
Subjective:  Gabrielle Edwards is a 24 y.o. G2P1001 at 30.5 weeks being seen today for NOB transfer from [facility] for GDM. Has not started CBG testing.  She is currently monitored for the following issues for this high-risk pregnancy and has Supervision of high risk pregnancy, antepartum and Gestational diabetes mellitus (GDM), antepartum on her problem list.  Patient reports no complaints.  Contractions: Irritability. Vag. Bleeding: None.  Movement: Present. Denies leaking of fluid.   The following portions of the patient's history were reviewed and updated as appropriate: allergies, current medications, past family history, past medical history, past social history, past surgical history and problem list. Problem list updated.  Objective:   Filed Vitals:   11/27/15 0830  BP: 113/74  Pulse: 93  Temp: 98.4 F (36.9 C)  Weight: 203 lb 14.4 oz (92.488 kg)    Fetal Status: Fetal Heart Rate (bpm): 150   Movement: Present     General:  Alert, oriented and cooperative. Patient is in no acute distress.  Skin: Skin is warm and dry. No rash noted.   Cardiovascular: Normal heart rate noted  Respiratory: Normal respiratory effort, no problems with respiration noted  Abdomen: Soft, gravid, appropriate for gestational age. Pain/Pressure: Present     Pelvic: Vag. Bleeding: None     Cervical exam declined        Extremities: Normal range of motion.  Edema: Trace  Mental Status: Normal mood and affect. Normal behavior. Normal judgment and thought content.   Urinalysis: Urine Protein: 1+ Urine Glucose: 2+   CBG: 71  Assessment and Plan:  Pregnancy: G2P1001 at [redacted]w[redacted]d 1. Supervision of high risk pregnancy, antepartum, third trimester   2. Gestational diabetes mellitus in third trimester, unspecified diabetic control  - POCT Glucose (CBG) - Blood Glucose Monitoring Suppl (ACCU-CHEK AVIVA PLUS) w/Device KIT; 1 Device by Does not apply route as directed.  Dispense: 1 kit; Refill: 0 - glucose blood  (ACCU-CHEK AVIVA) test strip; Use as instructed  Dispense: 100 each; Refill: 12 - ACCU-CHEK FASTCLIX LANCETS MISC; 1 Device by Does not apply route as directed.  Dispense: 1 each; Refill: 12  Preterm labor symptoms and general obstetric precautions including but not limited to vaginal bleeding, contractions, leaking of fluid and fetal movement were reviewed in detail with the patient. Please refer to After Visit Summary for other counseling recommendations.  Lengthy discussion about importance of testing blood sugars, bringing log book and keeping appointments. Discussed risks of uncontrolled DM including shoulder dystocia, delayed fetal lung maturity, brachial plexus palsy, IUFD, intrapartum death and extensive obstetric lacerations. Patient verbalizes understanding.  Return in about 1 week (around 12/04/2015).   VManya Silvas CNM

## 2015-11-30 ENCOUNTER — Other Ambulatory Visit: Payer: Self-pay | Admitting: *Deleted

## 2015-11-30 DIAGNOSIS — O24419 Gestational diabetes mellitus in pregnancy, unspecified control: Secondary | ICD-10-CM

## 2015-11-30 DIAGNOSIS — O0993 Supervision of high risk pregnancy, unspecified, third trimester: Secondary | ICD-10-CM

## 2015-11-30 MED ORDER — ACCU-CHEK FASTCLIX LANCETS MISC: 1.0000 | Status: DC

## 2015-11-30 MED ORDER — GLUCOSE BLOOD VI STRP: ORAL_STRIP | Status: DC

## 2015-12-04 ENCOUNTER — Ambulatory Visit (INDEPENDENT_AMBULATORY_CARE_PROVIDER_SITE_OTHER): Payer: Medicaid Other | Admitting: Advanced Practice Midwife

## 2015-12-04 VITALS — BP 113/70 | HR 89 | Wt 207.0 lb

## 2015-12-04 DIAGNOSIS — O24410 Gestational diabetes mellitus in pregnancy, diet controlled: Secondary | ICD-10-CM | POA: Diagnosis not present

## 2015-12-04 LAB — POCT URINALYSIS DIP (DEVICE)
Bilirubin Urine: NEGATIVE
GLUCOSE, UA: NEGATIVE mg/dL
HGB URINE DIPSTICK: NEGATIVE
KETONES UR: NEGATIVE mg/dL
Leukocytes, UA: NEGATIVE
Nitrite: NEGATIVE
PROTEIN: NEGATIVE mg/dL
SPECIFIC GRAVITY, URINE: 1.01 (ref 1.005–1.030)
Urobilinogen, UA: 0.2 mg/dL (ref 0.0–1.0)
pH: 6 (ref 5.0–8.0)

## 2015-12-04 MED ORDER — ACCU-CHEK SOFT TOUCH LANCETS MISC: Status: DC

## 2015-12-04 NOTE — Progress Notes (Signed)
11/27/15: Diabetes education : Gives no history of GDM.  Has a positive family for DM. Completed review of GDM and the self-care measures. Encouraged tow walk for 30 minutes daily for 5-7 dimes per week. Review of s/s of hypoglycemia and the prevention and treatment interventions. Provided meter instructions for the Accu-Check Aviva Plus meter.  Nursing is to send in the prescription to her local pharmacy. To monitor 4 ties per day (fasting and 2 hrs post meal levels). Instructed to bring her meter and blood glucose records to all clinic appointments. F/U in clinic as needed. Maggie May, RN, RD, LDN

## 2015-12-05 NOTE — Patient Instructions (Signed)
Braxton Hicks Contractions °Contractions of the uterus can occur throughout pregnancy. Contractions are not always a sign that you are in labor.  °WHAT ARE BRAXTON HICKS CONTRACTIONS?  °Contractions that occur before labor are called Braxton Hicks contractions, or false labor. Toward the end of pregnancy (32-34 weeks), these contractions can develop more often and may become more forceful. This is not true labor because these contractions do not result in opening (dilatation) and thinning of the cervix. They are sometimes difficult to tell apart from true labor because these contractions can be forceful and people have different pain tolerances. You should not feel embarrassed if you go to the hospital with false labor. Sometimes, the only way to tell if you are in true labor is for your health care provider to look for changes in the cervix. °If there are no prenatal problems or other health problems associated with the pregnancy, it is completely safe to be sent home with false labor and await the onset of true labor. °HOW CAN YOU TELL THE DIFFERENCE BETWEEN TRUE AND FALSE LABOR? °False Labor °· The contractions of false labor are usually shorter and not as hard as those of true labor.   °· The contractions are usually irregular.   °· The contractions are often felt in the front of the lower abdomen and in the groin.   °· The contractions may go away when you walk around or change positions while lying down.   °· The contractions get weaker and are shorter lasting as time goes on.   °· The contractions do not usually become progressively stronger, regular, and closer together as with true labor.   °True Labor °· Contractions in true labor last 30-70 seconds, become very regular, usually become more intense, and increase in frequency.   °· The contractions do not go away with walking.   °· The discomfort is usually felt in the top of the uterus and spreads to the lower abdomen and low back.   °· True labor can be  determined by your health care provider with an exam. This will show that the cervix is dilating and getting thinner.   °WHAT TO REMEMBER °· Keep up with your usual exercises and follow other instructions given by your health care provider.   °· Take medicines as directed by your health care provider.   °· Keep your regular prenatal appointments.   °· Eat and drink lightly if you think you are going into labor.   °· If Braxton Hicks contractions are making you uncomfortable:   °¨ Change your position from lying down or resting to walking, or from walking to resting.   °¨ Sit and rest in a tub of warm water.   °¨ Drink 2-3 glasses of water. Dehydration may cause these contractions.   °¨ Do slow and deep breathing several times an hour.   °WHEN SHOULD I SEEK IMMEDIATE MEDICAL CARE? °Seek immediate medical care if: °· Your contractions become stronger, more regular, and closer together.   °· You have fluid leaking or gushing from your vagina.   °· You have a fever.   °· You pass blood-tinged mucus.   °· You have vaginal bleeding.   °· You have continuous abdominal pain.   °· You have low back pain that you never had before.   °· You feel your baby's head pushing down and causing pelvic pressure.   °· Your baby is not moving as much as it used to.   °  °This information is not intended to replace advice given to you by your health care provider. Make sure you discuss any questions you have with your health care   provider. °  °Document Released: 07/15/2005 Document Revised: 07/20/2013 Document Reviewed: 04/26/2013 °Elsevier Interactive Patient Education ©2016 Elsevier Inc. ° °

## 2015-12-05 NOTE — Progress Notes (Signed)
Had difficulty obtaining testing supplies. Few sugars check, but pt very motivated.   Fasting CBGs: 90-100's  Postprandials:  90-120's   Subjective:  Gabrielle Edwards is a 25 y.o. G2P1001 at 5952w5d being seen today for ongoing prenatal care.  She is currently monitored for the following issues for this high-risk pregnancy and has Supervision of high risk pregnancy, antepartum and Gestational diabetes mellitus (GDM), antepartum on her problem list.  Patient reports no complaints.  Contractions: Not present. Vag. Bleeding: None.  Movement: Present. Denies leaking of fluid.   The following portions of the patient's history were reviewed and updated as appropriate: allergies, current medications, past family history, past medical history, past social history, past surgical history and problem list. Problem list updated.  Objective:   Filed Vitals:   12/04/15 1137  BP: 113/70  Pulse: 89  Weight: 207 lb (93.895 kg)    Fetal Status: Fetal Heart Rate (bpm): 143   Movement: Present     General:  Alert, oriented and cooperative. Patient is in no acute distress.  Skin: Skin is warm and dry. No rash noted.   Cardiovascular: Normal heart rate noted  Respiratory: Normal respiratory effort, no problems with respiration noted  Abdomen: Soft, gravid, appropriate for gestational age. Pain/Pressure: Present     Pelvic: Vag. Bleeding: None     Cervical exam deferred        Extremities: Normal range of motion.  Edema: None  Mental Status: Normal mood and affect. Normal behavior. Normal judgment and thought content.   Urinalysis: Urine Protein: 1+ Urine Glucose: 2+  Assessment and Plan:  Pregnancy: G2P1001 at 6464w6d  1. Diet controlled gestational diabetes mellitus (GDM), antepartum  - Lancets (ACCU-CHEK SOFT TOUCH) lancets; Check blood glucose 4 times a day. Use as instructed  Dispense: 120 each; Refill: 12  Preterm labor symptoms and general obstetric precautions including but not limited to  vaginal bleeding, contractions, leaking of fluid and fetal movement were reviewed in detail with the patient. Please refer to After Visit Summary for other counseling recommendations.  F/U 1 week to check CBGS Called pharmacy to verify what lancets will be covered by Medicaid and fil pt's Lancet pen.    Gabrielle Edwards, CNM

## 2015-12-11 ENCOUNTER — Ambulatory Visit (INDEPENDENT_AMBULATORY_CARE_PROVIDER_SITE_OTHER): Payer: Medicaid Other | Admitting: Family Medicine

## 2015-12-11 VITALS — BP 135/80 | HR 89 | Wt 202.3 lb

## 2015-12-11 DIAGNOSIS — O0993 Supervision of high risk pregnancy, unspecified, third trimester: Secondary | ICD-10-CM

## 2015-12-11 DIAGNOSIS — O24419 Gestational diabetes mellitus in pregnancy, unspecified control: Secondary | ICD-10-CM | POA: Diagnosis present

## 2015-12-11 LAB — POCT URINALYSIS DIP (DEVICE)
Bilirubin Urine: NEGATIVE
GLUCOSE, UA: NEGATIVE mg/dL
HGB URINE DIPSTICK: NEGATIVE
KETONES UR: NEGATIVE mg/dL
LEUKOCYTES UA: NEGATIVE
Nitrite: NEGATIVE
Protein, ur: 30 mg/dL — AB
SPECIFIC GRAVITY, URINE: 1.025 (ref 1.005–1.030)
UROBILINOGEN UA: 0.2 mg/dL (ref 0.0–1.0)
pH: 6 (ref 5.0–8.0)

## 2015-12-11 MED ORDER — GLYBURIDE 2.5 MG PO TABS: 2.5000 mg | ORAL_TABLET | Freq: Every day | ORAL | Status: DC

## 2015-12-11 NOTE — Progress Notes (Signed)
Subjective:  Delena Balivy Claire Baucum is a 25 y.o. G2P1001 at 9469w5d being seen today for ongoing prenatal care.  She is currently monitored for the following issues for this high-risk pregnancy and has Supervision of high risk pregnancy, antepartum and Gestational diabetes mellitus (GDM), antepartum on her problem list.  Patient reports no complaints.  Contractions: Irregular. Vag. Bleeding: None.  Movement: Present. Denies leaking of fluid.   The following portions of the patient's history were reviewed and updated as appropriate: allergies, current medications, past family history, past medical history, past social history, past surgical history and problem list. Problem list updated.  Objective:   Filed Vitals:   12/11/15 1041  BP: 135/80  Pulse: 89  Weight: 202 lb 4.8 oz (91.763 kg)    Fetal Status: Fetal Heart Rate (bpm): 142   Movement: Present     General:  Alert, oriented and cooperative. Patient is in no acute distress.  Skin: Skin is warm and dry. No rash noted.   Cardiovascular: Normal heart rate noted  Respiratory: Normal respiratory effort, no problems with respiration noted  Abdomen: Soft, gravid, appropriate for gestational age. Pain/Pressure: Present     Pelvic: Vag. Bleeding: None     Cervical exam deferred        Extremities: Normal range of motion.  Edema: Trace  Mental Status: Normal mood and affect. Normal behavior. Normal judgment and thought content.   Urinalysis: Urine Protein: 1+ Urine Glucose: Negative  Fasting 85-95 (3 of 7 elev) b- 78-125 (2 of 7 elev l-120-138 (all elevated) D-90-130 (2 of 7 elevated)  Assessment and Plan:  Pregnancy: G2P1001 at 1269w5d  1. Supervision of high risk pregnancy, antepartum, third trimester Transfer for [facility] for GDM  2. Gestational diabetes mellitus (GDM), antepartum, gestational diabetes method of control unspecified Starting glyburide 2.5 qAM to target post prandials Patient was counseled on diet changes for GDM  Preterm  labor symptoms and general obstetric precautions including but not limited to vaginal bleeding, contractions, leaking of fluid and fetal movement were reviewed in detail with the patient. Please refer to After Visit Summary for other counseling recommendations.  Return in about 1 week (around 12/18/2015) for Routine prenatal care, Start 2x weekly NST.   Federico FlakeKimberly Niles Cuma Polyakov, MD

## 2015-12-11 NOTE — Progress Notes (Signed)
Breastfeeding tip reviewed 

## 2015-12-11 NOTE — Patient Instructions (Addendum)
Places to have your son circumcised:    [phone] $480 by 4 wks  [facility] [phone] $244 by 4 wks  [facility] [phone] $175 by 2 wks  [facility] [phone] $250 by 7 days [facility] [phone] $150 by 4 wks  These prices sometimes change but are roughly what you can expect to pay. Please call and confirm pricing.   Circumcision is considered an elective/non-medically necessary procedure. There are many reasons parents decide to have their sons circumsized. During the first year of life circumcised males have a reduced risk of urinary tract infections but after this year the rates between circumcised males and uncircumcised males are the same.  It is safe to have your son circumcised outside of the hospital and the places above perform them regularly.    Gestational Diabetes Mellitus Gestational diabetes mellitus, often simply referred to as gestational diabetes, is a type of diabetes that some women develop during pregnancy. In gestational diabetes, the pancreas does not make enough insulin (a hormone), the cells are less responsive to the insulin that is made (insulin resistance), or both.Normally, insulin moves sugars from food into the tissue cells. The tissue cells use the sugars for energy. The lack of insulin or the lack of normal response to insulin causes excess sugars to build up in the blood instead of going into the tissue cells. As a result, high blood sugar (hyperglycemia) develops. The effect of high sugar (glucose) levels can cause many problems.  RISK FACTORS You have an increased chance of developing gestational diabetes if you have a family history of diabetes and also have one or more of the following risk factors:  A body mass index over 30 (obesity).  A previous pregnancy with gestational  diabetes.  An older age at the time of pregnancy. If blood glucose levels are kept in the normal range during pregnancy, women can have a healthy pregnancy. If your blood glucose levels are not well controlled, there may be risks to you, your unborn baby (fetus), your labor and delivery, or your newborn baby.  SYMPTOMS  If symptoms are experienced, they are much like symptoms you would normally expect during pregnancy. The symptoms of gestational diabetes include:   Increased thirst (polydipsia).  Increased urination (polyuria).  Increased urination during the night (nocturia).  Weight loss. This weight loss may be rapid.  Frequent, recurring infections.  Tiredness (fatigue).  Weakness.  Vision changes, such as blurred vision.  Fruity smell to your breath.  Abdominal pain. DIAGNOSIS Diabetes is diagnosed when blood glucose levels are increased. Your blood glucose level may be checked by one or more of the following blood tests:  A fasting blood glucose test. You will not be allowed to eat for at least 8 hours before a blood sample is taken.  A random blood glucose test. Your blood glucose is checked at any time of the day regardless of when you ate.  An oral glucose tolerance test (OGTT). Your blood glucose is measured after you have not eaten (fasted) for 1-3 hours and then after you drink a glucose-containing beverage. Since the hormones that cause insulin resistance are highest at about 24-28 weeks of a pregnancy, an OGTT is usually performed during that time. If you have risk factors, you may be screened for undiagnosed type 2 diabetes at your first prenatal visit. TREATMENT  Gestational diabetes should be managed first with diet and exercise. Medicines may be added only if they are needed.  You will need to take diabetes medicine  or insulin daily to keep blood glucose levels in the desired range.  You will need to match insulin dosing with exercise and healthy food  choices. If you have gestational diabetes, your treatment goal is to maintain the following blood glucose levels:  Before meals (preprandial): at or below 95 mg/dL.  After meals (postprandial):  One hour after a meal: at or below 140 mg/dL.  Two hours after a meal: at or below 120 mg/dL. If you have pre-existing type 1 or type 2 diabetes, your treatment goal is to maintain the following blood glucose levels:  Before meals, at bedtime, and overnight: 60-99 mg/dL.  After meals: peak of 100-129 mg/dL. HOME CARE INSTRUCTIONS   Have your hemoglobin A1c level checked twice a year.  Perform daily blood glucose monitoring as directed by your health care provider. It is common to perform frequent blood glucose monitoring.  Monitor urine ketones when you are ill and as directed by your health care provider.  Take your diabetes medicine and insulin as directed by your health care provider to maintain your blood glucose level in the desired range.  Never run out of diabetes medicine or insulin. It is needed every day.  Adjust insulin based on your intake of carbohydrates. Carbohydrates can raise blood glucose levels but need to be included in your diet. Carbohydrates provide vitamins, minerals, and fiber which are an essential part of a healthy diet. Carbohydrates are found in fruits, vegetables, whole grains, dairy products, legumes, and foods containing added sugars.  Eat healthy foods. Alternate 3 meals with 3 snacks.  Maintain a healthy weight gain. The usual total expected weight gain varies according to your prepregnancy body mass index (BMI).  Carry a medical alert card or wear your medical alert jewelry.  Carry a 15-gram carbohydrate snack with you at all times to treat low blood glucose (hypoglycemia). Some examples of 15-gram carbohydrate snacks include:  Glucose tablets, 3 or 4.  Glucose gel, 15-gram tube.  Raisins, 2 tablespoons (24 g).  Jelly beans, 6.  Animal crackers,  8.  Fruit juice, regular soda, or low-fat milk, 4 ounces (120 mL).  Gummy treats, 9.  Recognize hypoglycemia. Hypoglycemia during pregnancy occurs with blood glucose levels of 60 mg/dL and below. The risk for hypoglycemia increases when fasting or skipping meals, during or after intense exercise, and during sleep. Hypoglycemia symptoms can include:  Tremors or shakes.  Decreased ability to concentrate.  Sweating.  Increased heart rate.  Headache.  Dry mouth.  Hunger.  Irritability.  Anxiety.  Restless sleep.  Altered speech or coordination.  Confusion.  Treat hypoglycemia promptly. If you are alert and able to safely swallow, follow the 15:15 rule:  Take 15-20 grams of rapid-acting glucose or carbohydrate. Rapid-acting options include glucose gel, glucose tablets, or 4 ounces (120 mL) of fruit juice, regular soda, or low-fat milk.  Check your blood glucose level 15 minutes after taking the glucose.  Take 15-20 grams more of glucose if the repeat blood glucose level is still 70 mg/dL or below.  Eat a meal or snack within 1 hour once blood glucose levels return to normal.  Be alert to polyuria (excess urination) and polydipsia (excess thirst) which are early signs of hyperglycemia. An early awareness of hyperglycemia allows for prompt treatment. Treat hyperglycemia as directed by your health care provider.  Engage in at least 30 minutes of physical activity a day or as directed by your health care provider. Ten minutes of physical activity timed 30 minutes after each  meal is encouraged to control postprandial blood glucose levels.  Adjust your insulin dosing and food intake as needed if you start a new exercise or sport.  Follow your sick-day plan at any time you are unable to eat or drink as usual.  Avoid tobacco and alcohol use.  Keep all follow-up visits as directed by your health care provider.  Follow the advice of your health care provider regarding your  prenatal and post-delivery (postpartum) appointments, meal planning, exercise, medicines, vitamins, blood tests, other medical tests, and physical activities.  Perform daily skin and foot care. Examine your skin and feet daily for cuts, bruises, redness, nail problems, bleeding, blisters, or sores.  Brush your teeth and gums at least twice a day and floss at least once a day. Follow up with your dentist regularly.  Schedule an eye exam during the first trimester of your pregnancy or as directed by your health care provider.  Share your diabetes management plan with your workplace or school.  Stay up-to-date with immunizations.  Learn to manage stress.  Obtain ongoing diabetes education and support as needed.  Learn about and consider breastfeeding your baby.  You should have your blood sugar level checked 6-12 weeks after delivery. This is done with an oral glucose tolerance test (OGTT). SEEK MEDICAL CARE IF:   You are unable to eat food or drink fluids for more than 6 hours.  You have nausea and vomiting for more than 6 hours.  You have a blood glucose level of 200 mg/dL and you have ketones in your urine.  There is a change in mental status.  You develop vision problems.  You have a persistent headache.  You have upper abdominal pain or discomfort.  You develop an additional serious illness.  You have diarrhea for more than 6 hours.  You have been sick or have had a fever for a couple of days and are not getting better. SEEK IMMEDIATE MEDICAL CARE IF:   You have difficulty breathing.  You no longer feel the baby moving.  You are bleeding or have discharge from your vagina.  You start having premature contractions or labor. MAKE SURE YOU:  Understand these instructions.  Will watch your condition.  Will get help right away if you are not doing well or get worse.   This information is not intended to replace advice given to you by your health care provider.  Make sure you discuss any questions you have with your health care provider.   Document Released: 10/21/2000 Document Revised: 08/05/2014 Document Reviewed: 02/11/2012 Elsevier Interactive Patient Education [employer]

## 2015-12-18 ENCOUNTER — Other Ambulatory Visit: Payer: Medicaid Other

## 2015-12-21 ENCOUNTER — Ambulatory Visit (INDEPENDENT_AMBULATORY_CARE_PROVIDER_SITE_OTHER): Payer: Medicaid Other | Admitting: *Deleted

## 2015-12-21 VITALS — BP 126/69 | HR 116

## 2015-12-21 DIAGNOSIS — O24415 Gestational diabetes mellitus in pregnancy, controlled by oral hypoglycemic drugs: Secondary | ICD-10-CM | POA: Diagnosis present

## 2015-12-21 NOTE — Progress Notes (Signed)
CBG log reviewed. Since 5/16 when pt started glyburide, pt has had 2 fastings above 90 and 7 PP values slightly elevated 125-132 (mostly after breakfast)

## 2015-12-21 NOTE — Progress Notes (Signed)
NST 12/21/15 reactive 

## 2015-12-23 ENCOUNTER — Inpatient Hospital Stay (HOSPITAL_COMMUNITY)
Admission: AD | Admit: 2015-12-23 | Discharge: 2015-12-23 | Disposition: A | Discharge status: Home / Self Care | Payer: Medicaid Other | Source: Ambulatory Visit | Attending: Family Medicine | Admitting: Family Medicine

## 2015-12-23 ENCOUNTER — Inpatient Hospital Stay (HOSPITAL_COMMUNITY): Admission: AD | Admit: 2015-12-23 | Payer: Self-pay | Source: Ambulatory Visit

## 2015-12-23 ENCOUNTER — Encounter (HOSPITAL_COMMUNITY): Payer: Self-pay | Admitting: *Deleted

## 2015-12-23 DIAGNOSIS — Z3A34 34 weeks gestation of pregnancy: Secondary | ICD-10-CM | POA: Insufficient documentation

## 2015-12-23 DIAGNOSIS — O4703 False labor before 37 completed weeks of gestation, third trimester: Secondary | ICD-10-CM | POA: Diagnosis not present

## 2015-12-23 DIAGNOSIS — R109 Unspecified abdominal pain: Secondary | ICD-10-CM | POA: Diagnosis present

## 2015-12-23 LAB — URINALYSIS, ROUTINE W REFLEX MICROSCOPIC
BILIRUBIN URINE: NEGATIVE
Glucose, UA: NEGATIVE mg/dL
Hgb urine dipstick: NEGATIVE
KETONES UR: 15 mg/dL — AB
Leukocytes, UA: NEGATIVE
NITRITE: NEGATIVE
PROTEIN: NEGATIVE mg/dL
Specific Gravity, Urine: >1.03 — ABNORMAL HIGH (ref 1.005–1.030)
pH: 6 (ref 5.0–8.0)

## 2015-12-23 MED ORDER — ACETAMINOPHEN 325 MG PO TABS
650.0000 mg | ORAL_TABLET | Freq: Four times a day (QID) | ORAL | Status: DC | PRN
  Administered 2015-12-23: 650 mg via ORAL
  Filled 2015-12-23: qty 2

## 2015-12-23 NOTE — MAU Note (Signed)
Contractions since 1400. Denies LOF. Some pink vag d/c

## 2015-12-23 NOTE — MAU Provider Note (Signed)
  History     CSN: [id number]  Arrival date and time: 12/23/15 2116   First Provider Initiated Contact with Patient 12/23/15 2154     Chief Complaint  Patient presents with  . Abdominal Pain   HPI  Mrs. Gavel is a 24yo G2P1001 at 34weeks and 3days presenting for contractions since 2pm. Contractions every 15-2230minutes apart. Denies loss of fluid. Denies vaginal bleeding. Continues to note fetal movement.  Believes she lost part of her mucous plug today. Denies blurred vision or headache. Denies swelling.  OB History    Gravida Para Term Preterm AB TAB SAB Ectopic Multiple Living   2 1 1  0 0 0 0 0 0 1      Past Medical History  Diagnosis Date  . Normal first pregnancy confirmed, currently in third trimester 07/16/2014  . Hormone imbalance   . SVD (spontaneous vaginal delivery) 07/17/2014  . Gestational diabetes     2015  . Infection     uti  . BV (bacterial vaginosis)     Past Surgical History  Procedure Laterality Date  . No past surgeries      Family History  Problem Relation Age of Onset  . Hypertension Father   . Lupus Maternal Aunt   . Kidney disease Maternal Grandmother   . Diabetes Maternal Grandmother   . Heart disease Paternal Grandfather     Social History  Substance Use Topics  . Smoking status: Never Smoker   . Smokeless tobacco: Never Used  . Alcohol Use: No    Allergies: No Known Allergies  No prescriptions prior to admission    ROS Physical Exam   Blood pressure 126/66, pulse 95, temperature 97.7 F (36.5 C), resp. rate 18, height 5' (1.524 m), weight 209 lb (94.802 kg), last menstrual period 04/26/2015, unknown if currently breastfeeding.  Physical Exam  Constitutional: She appears well-developed and well-nourished. No distress.  Cardiovascular: Normal rate and regular rhythm.   Respiratory: Effort normal.  Musculoskeletal:  Trace edema  Psychiatric: She has a normal mood and affect. Her behavior is normal.  Dilation:  Fingertip Effacement (%): Thick Cervical Position: Posterior Station: -3 Exam by:: Dr Rumley/Danielle RN   MAU Course  Procedures  MDM Urinalysis with mild dehydration. No cervical change noted. Bedside US with cephalic positioning.  Assessment and Plan  # Contractions: Suspect Braxton Hicks with no cervical change noted. Return precautions given. Oral hydration encouraged. Stable for discharge.  [facility] Center[facility]aleigh Rumley 12/23/2015, 11:32 PM   I spoke with and examined patient and agree with resident/PA/SNM's note and plan of care.  2234w4d SIUP Preterm uc's w/o cervical change Cat I FHR Keep next pnv as scheduled Cheral MarkerKimberly R. Jesslynn Kruck, CNM, WHNP-BC 12/24/2015 12:25 AM

## 2015-12-23 NOTE — Discharge Instructions (Signed)
Braxton Hicks Contractions °Contractions of the uterus can occur throughout pregnancy. Contractions are not always a sign that you are in labor.  °WHAT ARE BRAXTON HICKS CONTRACTIONS?  °Contractions that occur before labor are called Braxton Hicks contractions, or false labor. Toward the end of pregnancy (32-34 weeks), these contractions can develop more often and may become more forceful. This is not true labor because these contractions do not result in opening (dilatation) and thinning of the cervix. They are sometimes difficult to tell apart from true labor because these contractions can be forceful and people have different pain tolerances. You should not feel embarrassed if you go to the hospital with false labor. Sometimes, the only way to tell if you are in true labor is for your health care provider to look for changes in the cervix. °If there are no prenatal problems or other health problems associated with the pregnancy, it is completely safe to be sent home with false labor and await the onset of true labor. °HOW CAN YOU TELL THE DIFFERENCE BETWEEN TRUE AND FALSE LABOR? °False Labor °· The contractions of false labor are usually shorter and not as hard as those of true labor.   °· The contractions are usually irregular.   °· The contractions are often felt in the front of the lower abdomen and in the groin.   °· The contractions may go away when you walk around or change positions while lying down.   °· The contractions get weaker and are shorter lasting as time goes on.   °· The contractions do not usually become progressively stronger, regular, and closer together as with true labor.   °True Labor °· Contractions in true labor last 30-70 seconds, become very regular, usually become more intense, and increase in frequency.   °· The contractions do not go away with walking.   °· The discomfort is usually felt in the top of the uterus and spreads to the lower abdomen and low back.   °· True labor can be  determined by your health care provider with an exam. This will show that the cervix is dilating and getting thinner.   °WHAT TO REMEMBER °· Keep up with your usual exercises and follow other instructions given by your health care provider.   °· Take medicines as directed by your health care provider.   °· Keep your regular prenatal appointments.   °· Eat and drink lightly if you think you are going into labor.   °· If Braxton Hicks contractions are making you uncomfortable:   °¨ Change your position from lying down or resting to walking, or from walking to resting.   °¨ Sit and rest in a tub of warm water.   °¨ Drink 2-3 glasses of water. Dehydration may cause these contractions.   °¨ Do slow and deep breathing several times an hour.   °WHEN SHOULD I SEEK IMMEDIATE MEDICAL CARE? °Seek immediate medical care if: °· Your contractions become stronger, more regular, and closer together.   °· You have fluid leaking or gushing from your vagina.   °· You have a fever.   °· You pass blood-tinged mucus.   °· You have vaginal bleeding.   °· You have continuous abdominal pain.   °· You have low back pain that you never had before.   °· You feel your baby's head pushing down and causing pelvic pressure.   °· Your baby is not moving as much as it used to.   °  °This information is not intended to replace advice given to you by your health care provider. Make sure you discuss any questions you have with your health care   provider. °  °Document Released: 07/15/2005 Document Revised: 07/20/2013 Document Reviewed: 04/26/2013 °Elsevier Interactive Patient Education ©2016 [employer] ° °

## 2015-12-27 ENCOUNTER — Telehealth: Payer: Self-pay | Admitting: General Practice

## 2015-12-27 NOTE — Telephone Encounter (Signed)
Pt has clinic appt on 6/1. Will address her question about FMLA at that time.

## 2015-12-27 NOTE — Telephone Encounter (Signed)
Patient called and left message stating she has a question about the FMLA form. Requests call back at [phone]. Called that number and phone just kept ringing. Called and left message on home number that we are trying to return your call.

## 2015-12-28 ENCOUNTER — Ambulatory Visit (INDEPENDENT_AMBULATORY_CARE_PROVIDER_SITE_OTHER): Payer: Medicaid Other | Admitting: *Deleted

## 2015-12-28 ENCOUNTER — Encounter: Payer: Self-pay | Admitting: *Deleted

## 2015-12-28 ENCOUNTER — Encounter: Payer: Self-pay | Admitting: General Practice

## 2015-12-28 VITALS — BP 135/78 | HR 113

## 2015-12-28 DIAGNOSIS — Z36 Encounter for antenatal screening of mother: Secondary | ICD-10-CM

## 2015-12-28 DIAGNOSIS — O24415 Gestational diabetes mellitus in pregnancy, controlled by oral hypoglycemic drugs: Secondary | ICD-10-CM

## 2015-12-28 MED ORDER — PRENATAL VITAMINS 0.8 MG PO TABS: 1.0000 | ORAL_TABLET | Freq: Every day | ORAL | Status: DC

## 2016-01-01 ENCOUNTER — Other Ambulatory Visit: Payer: Medicaid Other

## 2016-01-01 ENCOUNTER — Ambulatory Visit (INDEPENDENT_AMBULATORY_CARE_PROVIDER_SITE_OTHER): Payer: Medicaid Other | Admitting: Obstetrics and Gynecology

## 2016-01-01 ENCOUNTER — Other Ambulatory Visit (HOSPITAL_COMMUNITY)
Admission: RE | Admit: 2016-01-01 | Discharge: 2016-01-01 | Disposition: A | Discharge status: Home / Self Care | Payer: Medicaid Other | Source: Ambulatory Visit | Attending: Obstetrics and Gynecology | Admitting: Obstetrics and Gynecology

## 2016-01-01 VITALS — BP 137/68 | HR 97 | Wt 210.6 lb

## 2016-01-01 DIAGNOSIS — O24415 Gestational diabetes mellitus in pregnancy, controlled by oral hypoglycemic drugs: Secondary | ICD-10-CM

## 2016-01-01 DIAGNOSIS — O0990 Supervision of high risk pregnancy, unspecified, unspecified trimester: Secondary | ICD-10-CM

## 2016-01-01 DIAGNOSIS — Z113 Encounter for screening for infections with a predominantly sexual mode of transmission: Secondary | ICD-10-CM | POA: Insufficient documentation

## 2016-01-01 DIAGNOSIS — O24419 Gestational diabetes mellitus in pregnancy, unspecified control: Secondary | ICD-10-CM

## 2016-01-01 MED ORDER — GLYBURIDE 2.5 MG PO TABS: 3.7500 mg | ORAL_TABLET | Freq: Every day | ORAL | Status: DC

## 2016-01-01 NOTE — Progress Notes (Signed)
NST Note Date: 12/28/15 Gestational Age: 26/1 FHT: 150 baseline, +accels, no decel, mod variability Toco: quiet  A/P: rNST. Continue current plan of care  Gabrielle Edwards, Jr MD Attending [facility](Faculty Practice)

## 2016-01-01 NOTE — Progress Notes (Signed)
Subjective:  Gabrielle Edwards is a 25 y.o. G2P1001 at 2149w5d being seen today for ongoing prenatal care.  She is currently monitored for the following issues for this high-risk pregnancy and has Supervision of high risk pregnancy, antepartum and Gestational diabetes mellitus (GDM), antepartum on her problem list.  Patient reports no complaints.  Contractions: Irregular. Vag. Bleeding: None.  Movement: Present. Denies leaking of fluid.   The following portions of the patient's history were reviewed and updated as appropriate: allergies, current medications, past family history, past medical history, past social history, past surgical history and problem list. Problem list updated.  Objective:   Filed Vitals:   01/01/16 1607  BP: 137/68  Pulse: 97  Weight: 210 lb 9.6 oz (95.528 kg)    Fetal Status: Fetal Heart Rate (bpm): NST   Movement: Present     General:  Alert, oriented and cooperative. Patient is in no acute distress.  Skin: Skin is warm and dry. No rash noted.   Cardiovascular: Normal heart rate noted  Respiratory: Normal respiratory effort, no problems with respiration noted  Abdomen: Soft, gravid, appropriate for gestational age. Pain/Pressure: Absent     Pelvic: Vag. Bleeding: None     Cervical exam deferred        Extremities: Normal range of motion.  Edema: Trace  Mental Status: Normal mood and affect. Normal behavior. Normal judgment and thought content.   Urinalysis:      Assessment and Plan:  Pregnancy: G2P1001 at 9549w5d  1. Gestational diabetes mellitus (GDM) controlled on oral hypoglycemic drug, antepartum - Fetal nonstress test today, will append this note if not reactive - more than 1/2 of PPs are > 120, will increase daily dose of glyburide to 3.75 - ob f/u one week - continue 2x weekly test - ordering 38 wk u/s for growth - gbs, g, c today. nkda  Preterm labor symptoms and general obstetric precautions including but not limited to vaginal bleeding,  contractions, leaking of fluid and fetal movement were reviewed in detail with the patient. Please refer to After Visit Summary for other counseling recommendations.  Return in about 3 days (around 01/04/2016) for already has 6/8 and 6/12 appt. Kathrynn Running.   Micayla Brathwaite [place] Khamarion Bjelland, MD

## 2016-01-02 LAB — OB RESULTS CONSOLE GBS: STREP GROUP B AG: NEGATIVE

## 2016-01-03 LAB — GC/CHLAMYDIA PROBE AMP (~~LOC~~) NOT AT ARMC
CHLAMYDIA, DNA PROBE: NEGATIVE
Neisseria Gonorrhea: NEGATIVE

## 2016-01-04 ENCOUNTER — Ambulatory Visit (INDEPENDENT_AMBULATORY_CARE_PROVIDER_SITE_OTHER): Payer: Medicaid Other | Admitting: *Deleted

## 2016-01-04 VITALS — BP 136/72 | HR 114

## 2016-01-04 DIAGNOSIS — O24415 Gestational diabetes mellitus in pregnancy, controlled by oral hypoglycemic drugs: Secondary | ICD-10-CM | POA: Diagnosis not present

## 2016-01-04 DIAGNOSIS — Z36 Encounter for antenatal screening of mother: Secondary | ICD-10-CM | POA: Diagnosis not present

## 2016-01-05 LAB — CULTURE, BETA STREP (GROUP B ONLY)

## 2016-01-08 ENCOUNTER — Ambulatory Visit (INDEPENDENT_AMBULATORY_CARE_PROVIDER_SITE_OTHER): Payer: Medicaid Other | Admitting: Obstetrics & Gynecology

## 2016-01-08 VITALS — BP 130/87 | HR 101 | Wt 210.8 lb

## 2016-01-08 DIAGNOSIS — O24415 Gestational diabetes mellitus in pregnancy, controlled by oral hypoglycemic drugs: Secondary | ICD-10-CM | POA: Diagnosis present

## 2016-01-08 DIAGNOSIS — O24419 Gestational diabetes mellitus in pregnancy, unspecified control: Secondary | ICD-10-CM

## 2016-01-08 MED ORDER — GLYBURIDE 2.5 MG PO TABS: 5.0000 mg | ORAL_TABLET | Freq: Every day | ORAL | Status: DC

## 2016-01-08 NOTE — Progress Notes (Signed)
Subjective:  Gabrielle Edwards is a 25 y.o. G2P1001 at 4359w5d being seen today for ongoing prenatal care.  She is currently monitored for the following issues for this high-risk pregnancy and has Supervision of high risk pregnancy, antepartum and Gestational diabetes mellitus (GDM), antepartum on her problem list.  Patient reports no complaints.  Contractions: Irregular. Vag. Bleeding: None.  Movement: Present. Denies leaking of fluid.   The following portions of the patient's history were reviewed and updated as appropriate: allergies, current medications, past family history, past medical history, past social history, past surgical history and problem list. Problem list updated.  Objective:   Filed Vitals:   01/08/16 0811  BP: 130/87  Pulse: 101  Weight: 95.618 kg (210 lb 12.8 oz)    Fetal Status: Fetal Heart Rate (bpm): NST   Movement: Present     General:  Alert, oriented and cooperative. Patient is in no acute distress.  Skin: Skin is warm and dry. No rash noted.   Cardiovascular: Normal heart rate noted  Respiratory: Normal respiratory effort, no problems with respiration noted  Abdomen: Soft, gravid, appropriate for gestational age. Pain/Pressure: Present     Pelvic: Cervical exam deferred        Extremities: Normal range of motion.  Edema: Trace  Mental Status: Normal mood and affect. Normal behavior. Normal judgment and thought content.   Urinalysis:      Assessment and Plan:  Pregnancy: G2P1001 at 7159w5d  1. Gestational diabetes mellitus (GDM) controlled on oral hypoglycemic drug, antepartum Still with PP 120-142  2. Gestational diabetes mellitus (GDM), antepartum, gestational diabetes method of control unspecified Increase 5 mg - glyBURIDE (DIABETA) 2.5 MG tablet; Take 2 tablets (5 mg total) by mouth daily with breakfast.  Dispense: 60 tablet; Refill: 3  Term labor symptoms and general obstetric precautions including but not limited to vaginal bleeding, contractions,  leaking of fluid and fetal movement were reviewed in detail with the patient. Please refer to After Visit Summary for other counseling recommendations.  Return in about 3 days (around 01/11/2016) for 2x/wk as scheduled.  Adam PhenixJames G Leva Baine, MD

## 2016-01-08 NOTE — Progress Notes (Signed)
NST performed today was reviewed and was found to be reactive.  Continue recommended antenatal testing and prenatal care.  

## 2016-01-08 NOTE — Patient Instructions (Signed)
Gestational Diabetes Mellitus  Gestational diabetes mellitus, often simply referred to as gestational diabetes, is a type of diabetes that some women develop during pregnancy. In gestational diabetes, the pancreas does not make enough insulin (a hormone), the cells are less responsive to the insulin that is made (insulin resistance), or both. Normally, insulin moves sugars from food into the tissue cells. The tissue cells use the sugars for energy. The lack of insulin or the lack of normal response to insulin causes excess sugars to build up in the blood instead of going into the tissue cells. As a result, high blood sugar (hyperglycemia) develops. The effect of high sugar (glucose) levels can cause many problems.   RISK FACTORS  You have an increased chance of developing gestational diabetes if you have a family history of diabetes and also have one or more of the following risk factors:  · A body mass index over 30 (obesity).  · A previous pregnancy with gestational diabetes.  · An older age at the time of pregnancy.  If blood glucose levels are kept in the normal range during pregnancy, women can have a healthy pregnancy. If your blood glucose levels are not well controlled, there may be risks to you, your unborn baby (fetus), your labor and delivery, or your newborn baby.   SYMPTOMS   If symptoms are experienced, they are much like symptoms you would normally expect during pregnancy. The symptoms of gestational diabetes include:   · Increased thirst (polydipsia).  · Increased urination (polyuria).  · Increased urination during the night (nocturia).  · Weight loss. This weight loss may be rapid.  · Frequent, recurring infections.  · Tiredness (fatigue).  · Weakness.  · Vision changes, such as blurred vision.  · Fruity smell to your breath.  · Abdominal pain.  DIAGNOSIS  Diabetes is diagnosed when blood glucose levels are increased. Your blood glucose level may be checked by one or more of the following blood  tests:  · A fasting blood glucose test. You will not be allowed to eat for at least 8 hours before a blood sample is taken.  · A random blood glucose test. Your blood glucose is checked at any time of the day regardless of when you ate.  · An oral glucose tolerance test (OGTT). Your blood glucose is measured after you have not eaten (fasted) for 1-3 hours and then after you drink a glucose-containing beverage. Since the hormones that cause insulin resistance are highest at about 24-28 weeks of a pregnancy, an OGTT is usually performed during that time. If you have risk factors, you may be screened for undiagnosed type 2 diabetes at your first prenatal visit.  TREATMENT   Gestational diabetes should be managed first with diet and exercise. Medicines may be added only if they are needed.  · You will need to take diabetes medicine or insulin daily to keep blood glucose levels in the desired range.  · You will need to match insulin dosing with exercise and healthy food choices.  If you have gestational diabetes, your treatment goal is to maintain the following blood glucose levels:  · Before meals (preprandial): at or below 95 mg/dL.  · After meals (postprandial):    One hour after a meal: at or below 140 mg/dL.    Two hours after a meal: at or below 120 mg/dL.  If you have pre-existing type 1 or type 2 diabetes, your treatment goal is to maintain the following blood glucose levels:  · Before   meals, at bedtime, and overnight: 60-99 mg/dL.  · After meals: peak of 100-129 mg/dL.  HOME CARE INSTRUCTIONS   · Have your hemoglobin A1c level checked twice a year.  · Perform daily blood glucose monitoring as directed by your health care provider. It is common to perform frequent blood glucose monitoring.  · Monitor urine ketones when you are ill and as directed by your health care provider.  · Take your diabetes medicine and insulin as directed by your health care provider to maintain your blood glucose level in the desired  range.  ¨ Never run out of diabetes medicine or insulin. It is needed every day.  ¨ Adjust insulin based on your intake of carbohydrates. Carbohydrates can raise blood glucose levels but need to be included in your diet. Carbohydrates provide vitamins, minerals, and fiber which are an essential part of a healthy diet. Carbohydrates are found in fruits, vegetables, whole grains, dairy products, legumes, and foods containing added sugars.  · Eat healthy foods. Alternate 3 meals with 3 snacks.  · Maintain a healthy weight gain. The usual total expected weight gain varies according to your prepregnancy body mass index (BMI).  · Carry a medical alert card or wear your medical alert jewelry.  · Carry a 15-gram carbohydrate snack with you at all times to treat low blood glucose (hypoglycemia). Some examples of 15-gram carbohydrate snacks include:  ¨ Glucose tablets, 3 or 4.  ¨ Glucose gel, 15-gram tube.  ¨ Raisins, 2 tablespoons (24 g).  ¨ Jelly beans, 6.  ¨ Animal crackers, 8.  ¨ Fruit juice, regular soda, or low-fat milk, 4 ounces (120 mL).  ¨ Gummy treats, 9.  · Recognize hypoglycemia. Hypoglycemia during pregnancy occurs with blood glucose levels of 60 mg/dL and below. The risk for hypoglycemia increases when fasting or skipping meals, during or after intense exercise, and during sleep. Hypoglycemia symptoms can include:  ¨ Tremors or shakes.  ¨ Decreased ability to concentrate.  ¨ Sweating.  ¨ Increased heart rate.  ¨ Headache.  ¨ Dry mouth.  ¨ Hunger.  ¨ Irritability.  ¨ Anxiety.  ¨ Restless sleep.  ¨ Altered speech or coordination.  ¨ Confusion.  · Treat hypoglycemia promptly. If you are alert and able to safely swallow, follow the 15:15 rule:  ¨ Take 15-20 grams of rapid-acting glucose or carbohydrate. Rapid-acting options include glucose gel, glucose tablets, or 4 ounces (120 mL) of fruit juice, regular soda, or low-fat milk.  ¨ Check your blood glucose level 15 minutes after taking the glucose.  ¨ Take 15-20  grams more of glucose if the repeat blood glucose level is still 70 mg/dL or below.  ¨ Eat a meal or snack within 1 hour once blood glucose levels return to normal.  · Be alert to polyuria (excess urination) and polydipsia (excess thirst) which are early signs of hyperglycemia. An early awareness of hyperglycemia allows for prompt treatment. Treat hyperglycemia as directed by your health care provider.  · Engage in at least 30 minutes of physical activity a day or as directed by your health care provider. Ten minutes of physical activity timed 30 minutes after each meal is encouraged to control postprandial blood glucose levels.  · Adjust your insulin dosing and food intake as needed if you start a new exercise or sport.  · Follow your sick-day plan at any time you are unable to eat or drink as usual.  · Avoid tobacco and alcohol use.  · Keep all follow-up visits as directed   by your health care provider.  · Follow the advice of your health care provider regarding your prenatal and post-delivery (postpartum) appointments, meal planning, exercise, medicines, vitamins, blood tests, other medical tests, and physical activities.  · Perform daily skin and foot care. Examine your skin and feet daily for cuts, bruises, redness, nail problems, bleeding, blisters, or sores.  · Brush your teeth and gums at least twice a day and floss at least once a day. Follow up with your dentist regularly.  · Schedule an eye exam during the first trimester of your pregnancy or as directed by your health care provider.  · Share your diabetes management plan with your workplace or school.  · Stay up-to-date with immunizations.  · Learn to manage stress.  · Obtain ongoing diabetes education and support as needed.  · Learn about and consider breastfeeding your baby.  · You should have your blood sugar level checked 6-12 weeks after delivery. This is done with an oral glucose tolerance test (OGTT).  SEEK MEDICAL CARE IF:   · You are unable to  eat food or drink fluids for more than 6 hours.  · You have nausea and vomiting for more than 6 hours.  · You have a blood glucose level of 200 mg/dL and you have ketones in your urine.  · There is a change in mental status.  · You develop vision problems.  · You have a persistent headache.  · You have upper abdominal pain or discomfort.  · You develop an additional serious illness.  · You have diarrhea for more than 6 hours.  · You have been sick or have had a fever for a couple of days and are not getting better.  SEEK IMMEDIATE MEDICAL CARE IF:   · You have difficulty breathing.  · You no longer feel the baby moving.  · You are bleeding or have discharge from your vagina.  · You start having premature contractions or labor.  MAKE SURE YOU:  · Understand these instructions.  · Will watch your condition.  · Will get help right away if you are not doing well or get worse.     This information is not intended to replace advice given to you by your health care provider. Make sure you discuss any questions you have with your health care provider.     Document Released: 10/21/2000 Document Revised: 08/05/2014 Document Reviewed: 02/11/2012  Elsevier Interactive Patient Education ©2016 [employer]

## 2016-01-11 ENCOUNTER — Ambulatory Visit (INDEPENDENT_AMBULATORY_CARE_PROVIDER_SITE_OTHER): Payer: Medicaid Other | Admitting: *Deleted

## 2016-01-11 VITALS — BP 134/84 | HR 124

## 2016-01-11 DIAGNOSIS — O24415 Gestational diabetes mellitus in pregnancy, controlled by oral hypoglycemic drugs: Secondary | ICD-10-CM | POA: Diagnosis present

## 2016-01-11 DIAGNOSIS — Z36 Encounter for antenatal screening of mother: Secondary | ICD-10-CM | POA: Diagnosis not present

## 2016-01-11 NOTE — Progress Notes (Signed)
NST performed today was reviewed and was found to be reactive.  AFI was also normal.  Continue recommended antenatal testing and prenatal care. 

## 2016-01-12 ENCOUNTER — Encounter (HOSPITAL_COMMUNITY): Payer: Self-pay | Admitting: Obstetrics and Gynecology

## 2016-01-15 ENCOUNTER — Other Ambulatory Visit: Payer: Medicaid Other

## 2016-01-18 ENCOUNTER — Other Ambulatory Visit: Payer: Self-pay | Admitting: Obstetrics and Gynecology

## 2016-01-18 ENCOUNTER — Ambulatory Visit (HOSPITAL_COMMUNITY)
Admission: RE | Admit: 2016-01-18 | Discharge: 2016-01-18 | Disposition: A | Discharge status: Home / Self Care | Payer: Medicaid Other | Source: Ambulatory Visit | Attending: Obstetrics and Gynecology | Admitting: Obstetrics and Gynecology

## 2016-01-18 DIAGNOSIS — Z36 Encounter for antenatal screening of mother: Secondary | ICD-10-CM | POA: Insufficient documentation

## 2016-01-18 DIAGNOSIS — O24415 Gestational diabetes mellitus in pregnancy, controlled by oral hypoglycemic drugs: Secondary | ICD-10-CM

## 2016-01-18 DIAGNOSIS — Z3A38 38 weeks gestation of pregnancy: Secondary | ICD-10-CM | POA: Diagnosis present

## 2016-01-18 DIAGNOSIS — O3660X0 Maternal care for excessive fetal growth, unspecified trimester, not applicable or unspecified: Secondary | ICD-10-CM

## 2016-01-22 ENCOUNTER — Ambulatory Visit (INDEPENDENT_AMBULATORY_CARE_PROVIDER_SITE_OTHER): Payer: Medicaid Other | Admitting: Obstetrics and Gynecology

## 2016-01-22 ENCOUNTER — Ambulatory Visit (HOSPITAL_COMMUNITY)
Admission: RE | Admit: 2016-01-22 | Discharge: 2016-01-22 | Disposition: A | Discharge status: Home / Self Care | Payer: Medicaid Other | Source: Ambulatory Visit | Attending: Obstetrics and Gynecology | Admitting: Obstetrics and Gynecology

## 2016-01-22 ENCOUNTER — Other Ambulatory Visit: Payer: Medicaid Other

## 2016-01-22 VITALS — BP 130/82 | HR 87 | Wt 215.0 lb

## 2016-01-22 DIAGNOSIS — O0993 Supervision of high risk pregnancy, unspecified, third trimester: Secondary | ICD-10-CM

## 2016-01-22 DIAGNOSIS — Z3A38 38 weeks gestation of pregnancy: Secondary | ICD-10-CM | POA: Insufficient documentation

## 2016-01-22 DIAGNOSIS — O24415 Gestational diabetes mellitus in pregnancy, controlled by oral hypoglycemic drugs: Secondary | ICD-10-CM

## 2016-01-22 DIAGNOSIS — O289 Unspecified abnormal findings on antenatal screening of mother: Secondary | ICD-10-CM | POA: Diagnosis not present

## 2016-01-22 DIAGNOSIS — O3663X0 Maternal care for excessive fetal growth, third trimester, not applicable or unspecified: Secondary | ICD-10-CM

## 2016-01-22 DIAGNOSIS — O288 Other abnormal findings on antenatal screening of mother: Secondary | ICD-10-CM

## 2016-01-22 DIAGNOSIS — O3660X0 Maternal care for excessive fetal growth, unspecified trimester, not applicable or unspecified: Secondary | ICD-10-CM | POA: Insufficient documentation

## 2016-01-22 LAB — POCT URINALYSIS DIP (DEVICE)
BILIRUBIN URINE: NEGATIVE
Glucose, UA: NEGATIVE mg/dL
HGB URINE DIPSTICK: NEGATIVE
Ketones, ur: NEGATIVE mg/dL
Leukocytes, UA: NEGATIVE
NITRITE: NEGATIVE
PH: 6.5 (ref 5.0–8.0)
Protein, ur: 30 mg/dL — AB
Specific Gravity, Urine: 1.025 (ref 1.005–1.030)
UROBILINOGEN UA: 0.2 mg/dL (ref 0.0–1.0)

## 2016-01-22 NOTE — Progress Notes (Signed)
Subjective:  Gabrielle Edwards is a 25 y.o. G2P1001 at 2640w5d being seen today for ongoing prenatal care.  She is currently monitored for the following issues for this high-risk pregnancy and has Supervision of high risk pregnancy, antepartum; Gestational diabetes mellitus (GDM), antepartum; and Fetal macrosomia on her problem list.  Patient reports no complaints.  Contractions: Irregular. Vag. Bleeding: None.  Movement: Present. Denies leaking of fluid.   The following portions of the patient's history were reviewed and updated as appropriate: allergies, current medications, past family history, past medical history, past social history, past surgical history and problem list. Problem list updated.  Objective:   Filed Vitals:   01/22/16 0807  BP: 130/82  Pulse: 87  Weight: 215 lb (97.523 kg)    Fetal Status:     Movement: Present     General:  Alert, oriented and cooperative. Patient is in no acute distress.  Skin: Skin is warm and dry. No rash noted.   Cardiovascular: Normal heart rate noted  Respiratory: Normal respiratory effort, no problems with respiration noted  Abdomen: Soft, gravid, appropriate for gestational age. Pain/Pressure: Present     Pelvic: Cervical exam deferred        Extremities: Normal range of motion.  Edema: Trace  Mental Status: Normal mood and affect. Normal behavior. Normal judgment and thought content.   Urinalysis: Urine Protein: 1+ Urine Glucose: Negative  Assessment and Plan:  Pregnancy: G2P1001 at 3040w5d  1. Gestational diabetes mellitus (GDM) controlled on oral hypoglycemic drug, antepartum CBGs reviewed and majority within range. A few pp as high as 132 Continue glyburide 5 mg with breakfast - Fetal nonstress test- NST reviewed and non reactive. Patient sent for BPP   2. Supervision of high risk pregnancy, antepartum, third trimester Patient scheduled for IOL at 39 weeks on 01/24/16  3. Fetal macrosomia, third trimester, not applicable or  unspecified fetus Reviewed risks associated with fetal macrosomia including but not limited to risks of shoulder dystocia which may cause cervical root nerve damage, and risks of cesarean section.  Term labor symptoms and general obstetric precautions including but not limited to vaginal bleeding, contractions, leaking of fluid and fetal movement were reviewed in detail with the patient. Please refer to After Visit Summary for other counseling recommendations.  No Follow-up on file.   Catalina AntiguaPeggy Amer Alcindor, MD

## 2016-01-22 NOTE — Progress Notes (Signed)
Breastfeeding discussed with patient  

## 2016-01-23 ENCOUNTER — Telehealth (HOSPITAL_COMMUNITY): Payer: Self-pay | Admitting: *Deleted

## 2016-01-23 ENCOUNTER — Encounter (HOSPITAL_COMMUNITY): Payer: Self-pay | Admitting: *Deleted

## 2016-01-23 NOTE — Telephone Encounter (Signed)
Preadmission screen  

## 2016-01-24 ENCOUNTER — Encounter (HOSPITAL_COMMUNITY): Payer: Self-pay

## 2016-01-24 ENCOUNTER — Inpatient Hospital Stay (HOSPITAL_COMMUNITY): Payer: Medicaid Other | Admitting: Anesthesiology

## 2016-01-24 ENCOUNTER — Inpatient Hospital Stay (HOSPITAL_COMMUNITY)
Admission: RE | Admit: 2016-01-24 | Discharge: 2016-01-28 | DRG: 765 | Disposition: A | Discharge status: Home / Self Care | Payer: Medicaid Other | Source: Ambulatory Visit | Attending: Family Medicine | Admitting: Family Medicine

## 2016-01-24 VITALS — BP 122/68 | HR 92 | Temp 97.6°F | Resp 18 | Ht 60.0 in | Wt 210.0 lb

## 2016-01-24 DIAGNOSIS — O3663X0 Maternal care for excessive fetal growth, third trimester, not applicable or unspecified: Secondary | ICD-10-CM | POA: Diagnosis present

## 2016-01-24 DIAGNOSIS — Z302 Encounter for sterilization: Secondary | ICD-10-CM | POA: Diagnosis not present

## 2016-01-24 DIAGNOSIS — O324XX0 Maternal care for high head at term, not applicable or unspecified: Secondary | ICD-10-CM | POA: Diagnosis present

## 2016-01-24 DIAGNOSIS — Z6841 Body Mass Index (BMI) 40.0 and over, adult: Secondary | ICD-10-CM | POA: Diagnosis not present

## 2016-01-24 DIAGNOSIS — Z3A39 39 weeks gestation of pregnancy: Secondary | ICD-10-CM | POA: Diagnosis not present

## 2016-01-24 DIAGNOSIS — O24419 Gestational diabetes mellitus in pregnancy, unspecified control: Secondary | ICD-10-CM | POA: Diagnosis present

## 2016-01-24 DIAGNOSIS — O24425 Gestational diabetes mellitus in childbirth, controlled by oral hypoglycemic drugs: Secondary | ICD-10-CM | POA: Diagnosis not present

## 2016-01-24 DIAGNOSIS — O0993 Supervision of high risk pregnancy, unspecified, third trimester: Secondary | ICD-10-CM

## 2016-01-24 DIAGNOSIS — O24415 Gestational diabetes mellitus in pregnancy, controlled by oral hypoglycemic drugs: Secondary | ICD-10-CM

## 2016-01-24 DIAGNOSIS — Z98891 History of uterine scar from previous surgery: Secondary | ICD-10-CM

## 2016-01-24 DIAGNOSIS — Z8249 Family history of ischemic heart disease and other diseases of the circulatory system: Secondary | ICD-10-CM | POA: Diagnosis not present

## 2016-01-24 DIAGNOSIS — O99214 Obesity complicating childbirth: Secondary | ICD-10-CM | POA: Diagnosis present

## 2016-01-24 DIAGNOSIS — Z833 Family history of diabetes mellitus: Secondary | ICD-10-CM

## 2016-01-24 LAB — CBC
HEMATOCRIT: 38.9 % (ref 36.0–46.0)
HEMOGLOBIN: 13.3 g/dL (ref 12.0–15.0)
MCH: 27.7 pg (ref 26.0–34.0)
MCHC: 34.2 g/dL (ref 30.0–36.0)
MCV: 80.9 fL (ref 78.0–100.0)
Platelets: 165 10*3/uL (ref 150–400)
RBC: 4.81 MIL/uL (ref 3.87–5.11)
RDW: 16.7 % — ABNORMAL HIGH (ref 11.5–15.5)
WBC: 13.5 10*3/uL — ABNORMAL HIGH (ref 4.0–10.5)

## 2016-01-24 LAB — RPR: RPR: NONREACTIVE

## 2016-01-24 LAB — TYPE AND SCREEN
ABO/RH(D): O POS
Antibody Screen: NEGATIVE

## 2016-01-24 LAB — GLUCOSE, CAPILLARY
GLUCOSE-CAPILLARY: 100 mg/dL — AB (ref 65–99)
GLUCOSE-CAPILLARY: 127 mg/dL — AB (ref 65–99)
Glucose-Capillary: 78 mg/dL (ref 65–99)
Glucose-Capillary: 84 mg/dL (ref 65–99)

## 2016-01-24 MED ORDER — OXYTOCIN 40 UNITS IN LACTATED RINGERS INFUSION - SIMPLE MED: 2.5000 [IU]/h | INTRAVENOUS | Status: DC

## 2016-01-24 MED ORDER — PHENYLEPHRINE 40 MCG/ML (10ML) SYRINGE FOR IV PUSH (FOR BLOOD PRESSURE SUPPORT)
80.0000 ug | PREFILLED_SYRINGE | INTRAVENOUS | Status: AC | PRN
  Administered 2016-01-24 – 2016-01-26 (×2): 80 ug via INTRAVENOUS
  Administered 2016-01-26: 40 ug via INTRAVENOUS
  Administered 2016-01-26: 80 ug via INTRAVENOUS

## 2016-01-24 MED ORDER — FENTANYL CITRATE (PF) 100 MCG/2ML IJ SOLN
100.0000 ug | INTRAMUSCULAR | Status: DC | PRN
  Administered 2016-01-24: 100 ug via INTRAVENOUS
  Filled 2016-01-24 (×2): qty 2

## 2016-01-24 MED ORDER — FENTANYL 2.5 MCG/ML BUPIVACAINE 1/10 % EPIDURAL INFUSION (WH - ANES)
14.0000 mL/h | INTRAMUSCULAR | Status: DC | PRN
  Administered 2016-01-24 – 2016-01-25 (×4): 14 mL/h via EPIDURAL
  Filled 2016-01-24 (×4): qty 125

## 2016-01-24 MED ORDER — OXYTOCIN 40 UNITS IN LACTATED RINGERS INFUSION - SIMPLE MED
1.0000 m[IU]/min | INTRAVENOUS | Status: DC
  Administered 2016-01-24: 2 m[IU]/min via INTRAVENOUS
  Administered 2016-01-25: 4 m[IU]/min via INTRAVENOUS

## 2016-01-24 MED ORDER — MISOPROSTOL 50MCG HALF TABLET: 50.0000 ug | ORAL_TABLET | ORAL | Status: DC | PRN

## 2016-01-24 MED ORDER — LACTATED RINGERS IV SOLN
INTRAVENOUS | Status: DC
  Administered 2016-01-24 – 2016-01-25 (×6): via INTRAVENOUS

## 2016-01-24 MED ORDER — PHENYLEPHRINE 40 MCG/ML (10ML) SYRINGE FOR IV PUSH (FOR BLOOD PRESSURE SUPPORT)
80.0000 ug | PREFILLED_SYRINGE | INTRAVENOUS | Status: DC | PRN
  Filled 2016-01-24: qty 10

## 2016-01-24 MED ORDER — DIPHENHYDRAMINE HCL 50 MG/ML IJ SOLN: 12.5000 mg | INTRAMUSCULAR | Status: DC | PRN

## 2016-01-24 MED ORDER — OXYTOCIN 40 UNITS IN LACTATED RINGERS INFUSION - SIMPLE MED
1.0000 m[IU]/min | INTRAVENOUS | Status: DC
  Filled 2016-01-24: qty 1000

## 2016-01-24 MED ORDER — EPHEDRINE 5 MG/ML INJ
10.0000 mg | INTRAVENOUS | Status: DC | PRN
Start: 2016-01-24 — End: 2016-01-26

## 2016-01-24 MED ORDER — LIDOCAINE HCL (PF) 1 % IJ SOLN
INTRAMUSCULAR | Status: DC | PRN
  Administered 2016-01-24: 4 mL
  Administered 2016-01-24: 6 mL via EPIDURAL
  Administered 2016-01-25 (×2): 5 mL via EPIDURAL

## 2016-01-24 MED ORDER — SOD CITRATE-CITRIC ACID 500-334 MG/5ML PO SOLN
30.0000 mL | ORAL | Status: DC | PRN
  Administered 2016-01-25: 30 mL via ORAL
  Filled 2016-01-24: qty 15

## 2016-01-24 MED ORDER — LACTATED RINGERS IV SOLN
500.0000 mL | Freq: Once | INTRAVENOUS | Status: AC
  Administered 2016-01-24: 500 mL via INTRAVENOUS

## 2016-01-24 MED ORDER — OXYTOCIN BOLUS FROM INFUSION: 500.0000 mL | INTRAVENOUS | Status: DC

## 2016-01-24 MED ORDER — TERBUTALINE SULFATE 1 MG/ML IJ SOLN: 0.2500 mg | Freq: Once | INTRAMUSCULAR | Status: DC | PRN

## 2016-01-24 MED ORDER — MISOPROSTOL 25 MCG QUARTER TABLET
25.0000 ug | ORAL_TABLET | ORAL | Status: DC | PRN
  Administered 2016-01-24 (×2): 25 ug via VAGINAL
  Filled 2016-01-24 (×2): qty 0.25

## 2016-01-24 MED ORDER — LACTATED RINGERS IV SOLN
500.0000 mL | INTRAVENOUS | Status: DC | PRN
  Administered 2016-01-25 (×2): 500 mL via INTRAVENOUS

## 2016-01-24 MED ORDER — ONDANSETRON HCL 4 MG/2ML IJ SOLN
4.0000 mg | Freq: Four times a day (QID) | INTRAMUSCULAR | Status: DC | PRN
Start: 2016-01-24 — End: 2016-01-26

## 2016-01-24 MED ORDER — LIDOCAINE HCL (PF) 1 % IJ SOLN
30.0000 mL | INTRAMUSCULAR | Status: DC | PRN
  Filled 2016-01-24: qty 30

## 2016-01-24 MED ORDER — EPHEDRINE 5 MG/ML INJ: 10.0000 mg | INTRAVENOUS | Status: DC | PRN

## 2016-01-24 MED ORDER — ACETAMINOPHEN 325 MG PO TABS: 650.0000 mg | ORAL_TABLET | ORAL | Status: DC | PRN

## 2016-01-24 NOTE — Anesthesia Pain Management Evaluation Note (Signed)
  CRNA Pain Management Visit Note  Patient: Gabrielle Edwards, 25 y.o., female  "Hello I am a member of the anesthesia team at [facility]. We have an anesthesia team available at all times to provide care throughout the hospital, including epidural management and anesthesia for C-section. I don't know your plan for the delivery whether it a natural birth, water birth, IV sedation, nitrous supplementation, doula or epidural, but we want to meet your pain goals."   1.Was your pain managed to your expectations on prior hospitalizations?   Yes   2.What is your expectation for pain management during this hospitalization?     Epidural  3.How can we help you reach that goal? epidural  Record the patient's initial score and the patient's pain goal.   Pain: 6  Pain Goal: 8 [facility] wants you to be able to say your pain was always managed very well.  [name],Sana Tessmer 01/24/2016

## 2016-01-24 NOTE — H&P (Signed)
LABOR AND DELIVERY ADMISSION HISTORY AND PHYSICAL NOTE  Gabrielle Edwards is a 25 y.o. female G2P1001 with IUP at 70w0dby LMP presenting for IOL for A2GDM.  Prenatal risk factors: GDM, obesity, suspected macrosomia (H/O NSVD w/ 8lb infant) She reports positive fetal movement. She denies leakage of fluid or vaginal bleeding.  Prenatal History/Complications:  Past Medical History: Past Medical History  Diagnosis Date  . Normal first pregnancy confirmed, currently in third trimester 07/16/2014  . Hormone imbalance   . SVD (spontaneous vaginal delivery) 07/17/2014  . Infection     uti  . BV (bacterial vaginosis)   . Gestational diabetes     2015 and currently glyburide    Past Surgical History: Past Surgical History  Procedure Laterality Date  . No past surgeries      Obstetrical History: OB History    Gravida Para Term Preterm AB TAB SAB Ectopic Multiple Living   2 1 1  0 0 0 0 0 0 1      Social History: Social History   Social History  . Marital Status: Single    Spouse Name: N/A  . Number of Children: N/A  . Years of Education: N/A   Social History Main Topics  . Smoking status: Never Smoker   . Smokeless tobacco: Never Used  . Alcohol Use: No  . Drug Use: No  . Sexual Activity: Yes    Birth Control/ Protection: None   Other Topics Concern  . Not on file   Social History Narrative    Family History: Family History  Problem Relation Age of Onset  . Hypertension Father   . Lupus Maternal Aunt   . Kidney disease Maternal Grandmother   . Diabetes Maternal Grandmother   . Heart disease Paternal Grandfather     Allergies: No Known Allergies  Prescriptions prior to admission  Medication Sig Dispense Refill Last Dose  . Blood Glucose Monitoring Suppl (ACCU-CHEK AVIVA PLUS) w/Device KIT 1 Device by Does not apply route as directed. 1 kit 0 Taking  . glucose blood (ACCU-CHEK AVIVA) test strip Check blood sugars 4 times a day 100 each 12 Taking  .  glyBURIDE (DIABETA) 2.5 MG tablet Take 2 tablets (5 mg total) by mouth daily with breakfast. 60 tablet 3 Taking  . Prenatal Multivit-Min-Fe-FA (PRENATAL VITAMINS) 0.8 MG tablet Take 1 tablet by mouth daily. 30 tablet 12 Taking     Review of Systems   All systems reviewed and negative except as stated in HPI  Last menstrual period 04/26/2015, unknown if currently breastfeeding. General appearance: alert, cooperative and appears stated age Lungs: clear to auscultation bilaterally Heart: regular rate and rhythm Abdomen: soft, non-tender; bowel sounds normal Extremities: No calf swelling or tenderness Presentation: cephalic Fetal monitoring: Baseline 135, mod variability, +acels, no decels-reactive Uterine activity: Ctx 3-4 min     Prenatal labs: ABO, Rh: O/Positive/-- (04/13 0000) Antibody: Negative (04/13 0000) Rubella: Immune RPR: Nonreactive (04/13 0000)  HBsAg: Negative (04/13 0000)  HIV: Non-reactive (04/13 0000)  GBS:    negative 1 hr Glucola: Third trimester= 155  3 hour GTT = 100-174-162-146->GDM  Genetic screening:  normal Anatomy [place] Cephalic, anterior placenta, AFI 9.87, 4336g *9lb 9oz) >97%tile  Prenatal Transfer Tool  Maternal Diabetes: Yes:  Diabetes Type:  Insulin/Medication controlled Genetic Screening: Normal Maternal Ultrasounds/Referrals: Normal Fetal Ultrasounds or other Referrals:  None Maternal Substance Abuse:  No Significant Maternal Medications:  None Significant Maternal Lab Results: None  No results found for this or any previous visit (  from the past 24 hour(s)).  Patient Active Problem List   Diagnosis Date Noted  . Fetal macrosomia 01/22/2016  . Supervision of high risk pregnancy, antepartum 11/22/2015  . Gestational diabetes mellitus (GDM), antepartum 11/22/2015    Assessment: Gabrielle Edwards is a 25 y.o. G2P1001 at 87w0dhere for IOL for A2GDM on glyburide  #Labor:Latent, start with cytotec #Pain: Epidural, fentanyl,  NO #FWB: Cat-1 #ID:  GBS negative #MOF: Breast #MOC:BTL papers signed #Circ:  Yes, outpatient  KMelany Guernsey6/28/2017, 7:56 AM

## 2016-01-24 NOTE — Progress Notes (Signed)
Labor Progress Note Gabrielle Edwards is a 25 y.o. G2P1001 at 641w0d presented for  IOL for A2GDM with suspected macrosomia S: feeling well. Minimal ctx.   O:  BP 131/79 mmHg  Pulse 84  Temp(Src) 98 F (36.7 C) (Oral)  Resp 18  Ht 5' (1.524 m)  Wt 215 lb (97.523 kg)  BMI 41.99 kg/m2  LMP 04/26/2015 EFM: 140/mod/+accels, no decels  CVE: Dilation: 1 Effacement (%): Thick Cervical Position: Posterior Station: -3 Presentation: Vertex Exam by:: A.Davis,RN  Placed FB and inflated with 60cc   A&P: 25 y.o. G2P1001 391w0d with A2GDM  #Labor: Progressing well. FB placed with 60 cc #Pain: prn meds #FWB: Cat I #GBS negative  #A2GDM- monitor CBG q4 in latent. Modified carb diet.   Federico FlakeKimberly Niles Emberly Tomasso, MD 7:02 PM

## 2016-01-24 NOTE — Anesthesia Procedure Notes (Signed)

## 2016-01-24 NOTE — Anesthesia Preprocedure Evaluation (Addendum)
Anesthesia Evaluation  Patient identified by MRN, date of birth, ID band Patient awake    Reviewed: Allergy & Precautions, H&P , Patient's Chart, lab work & pertinent test results  Airway Mallampati: IV  TM Distance: >3 FB Neck ROM: full  Mouth opening: Limited Mouth Opening Comment: Swollen face Suspect Difficult intubation Discussed my concerns with Dr Emelda FearFerguson at 22:45 Dental  (+) Teeth Intact   Pulmonary    breath sounds clear to auscultation       Cardiovascular  Rhythm:regular Rate:Normal     Neuro/Psych    GI/Hepatic   Endo/Other  diabetes, GestationalMorbid obesity  Renal/GU      Musculoskeletal   Abdominal   Peds  Hematology   Anesthesia Other Findings       Reproductive/Obstetrics (+) Pregnancy                            Anesthesia Physical Anesthesia Plan  ASA: III  Anesthesia Plan: Epidural   Post-op Pain Management:    Induction:   Airway Management Planned:   Additional Equipment:   Intra-op Plan:   Post-operative Plan:   Informed Consent: I have reviewed the patients History and Physical, chart, labs and discussed the procedure including the risks, benefits and alternatives for the proposed anesthesia with the patient or authorized representative who has indicated his/her understanding and acceptance.   Dental Advisory Given  Plan Discussed with:   Anesthesia Plan Comments: (Labs checked- platelets confirmed with RN in room. Fetal heart tracing, per RN, reported to be stable enough for sitting procedure. Discussed epidural, and patient consents to the procedure:  included risk of possible headache,backache, failed block, allergic reaction, and nerve injury. This patient was asked if she had any questions or concerns before the procedure started.)        Anesthesia Quick Evaluation

## 2016-01-24 NOTE — Progress Notes (Signed)
LABOR PROGRESS NOTE  Gabrielle Edwards is a 25 y.o. G2P1001 at 4270w0d  admitted for IOL for A2GDM  Subjective: Pt is starting to feel mild discomfort with contractions.  Membrane intact, no VB  Objective: BP 133/86 mmHg  Pulse 98  Temp(Src) 98.3 F (36.8 C) (Oral)  Resp 18  Ht 5' (1.524 m)  Wt 215 lb (97.523 kg)  BMI 41.99 kg/m2  LMP 04/26/2015 or  Filed Vitals:   01/24/16 0809 01/24/16 0814 01/24/16 1018  BP: 141/96  133/86  Pulse: 110  98  Temp: 98.3 F (36.8 C)  98.3 F (36.8 C)  TempSrc: Oral  Oral  Resp: 18  18  Height:  5' (1.524 m)   Weight:  215 lb (97.523 kg)      Dilation: Closed Effacement (%): Thick Cervical Position: Posterior Station: -3 Presentation: Vertex Exam by:: Davis,RN  Labs: Lab Results  Component Value Date   WBC 13.5* 01/24/2016   HGB 13.3 01/24/2016   HCT 38.9 01/24/2016   MCV 80.9 01/24/2016   PLT 165 01/24/2016    Patient Active Problem List   Diagnosis Date Noted  . Gestational diabetes 01/24/2016  . Fetal macrosomia 01/22/2016  . Supervision of high risk pregnancy, antepartum 11/22/2015  . Gestational diabetes mellitus (GDM), antepartum 11/22/2015    Assessment / Plan: 25 y.o. G2P1001 at 3570w0d here for IOL for A2GDM, suspected macrosomia  Labor: Latent, cytotec  Fetal Wellbeing:  Cat-1 Pain Control:  Fentanyl, NO, epidural when requested Anticipated MOD:  NSVD  Olena LeatherwoodKelly M Christain Mcraney, MD 01/24/2016, 1:47 PM

## 2016-01-25 ENCOUNTER — Encounter (HOSPITAL_COMMUNITY)
Admission: RE | Disposition: A | Discharge status: Home / Self Care | Payer: Self-pay | Source: Ambulatory Visit | Attending: Family Medicine

## 2016-01-25 LAB — GLUCOSE, CAPILLARY
GLUCOSE-CAPILLARY: 91 mg/dL (ref 65–99)
GLUCOSE-CAPILLARY: 71 mg/dL (ref 65–99)
GLUCOSE-CAPILLARY: 100 mg/dL — AB (ref 65–99)
GLUCOSE-CAPILLARY: 104 mg/dL — AB (ref 65–99)
GLUCOSE-CAPILLARY: 83 mg/dL (ref 65–99)
Glucose-Capillary: 134 mg/dL — ABNORMAL HIGH (ref 65–99)

## 2016-01-25 SURGERY — Surgical Case
Anesthesia: Epidural

## 2016-01-25 MED ORDER — LACTATED RINGERS IV SOLN: 125.0000 mL/h | INTRAVENOUS | Status: DC

## 2016-01-25 MED ORDER — SODIUM CHLORIDE 0.9 % IV SOLN
2.0000 g | Freq: Four times a day (QID) | INTRAVENOUS | Status: DC
  Administered 2016-01-25 (×2): 2 g via INTRAVENOUS
  Filled 2016-01-25 (×4): qty 2000

## 2016-01-25 MED ORDER — GENTAMICIN SULFATE 40 MG/ML IJ SOLN
160.0000 mg | Freq: Three times a day (TID) | INTRAVENOUS | Status: DC
  Administered 2016-01-25 (×2): 160 mg via INTRAVENOUS
  Filled 2016-01-25 (×3): qty 4

## 2016-01-25 MED ORDER — MORPHINE SULFATE (PF) 0.5 MG/ML IJ SOLN
INTRAMUSCULAR | Status: AC
  Filled 2016-01-25: qty 10

## 2016-01-25 MED ORDER — CLINDAMYCIN PHOSPHATE 900 MG/50ML IV SOLN
900.0000 mg | Freq: Once | INTRAVENOUS | Status: DC
  Filled 2016-01-25: qty 50

## 2016-01-25 SURGICAL SUPPLY — 35 items
BENZOIN TINCTURE PRP APPL 2/3 (GAUZE/BANDAGES/DRESSINGS) ×2 IMPLANT
CATH ROBINSON RED A/P 16FR (CATHETERS) IMPLANT
CLAMP CORD UMBIL (MISCELLANEOUS) IMPLANT
CLIP FILSHIE TUBAL LIGA STRL (Clip) ×2 IMPLANT
CLOTH BEACON ORANGE TIMEOUT ST (SAFETY) ×2 IMPLANT
DRSG OPSITE POSTOP 4X10 (GAUZE/BANDAGES/DRESSINGS) ×2 IMPLANT
DURAPREP 26ML APPLICATOR (WOUND CARE) ×2 IMPLANT
ELECT REM PT RETURN 9FT ADLT (ELECTROSURGICAL) ×2
ELECTRODE REM PT RTRN 9FT ADLT (ELECTROSURGICAL) ×1 IMPLANT
EXTRACTOR VACUUM M CUP 4 TUBE (SUCTIONS) IMPLANT
GLOVE BIOGEL PI IND STRL 7.0 (GLOVE) ×1 IMPLANT
GLOVE BIOGEL PI IND STRL 7.5 (GLOVE) ×2 IMPLANT
GLOVE BIOGEL PI INDICATOR 7.0 (GLOVE) ×1
GLOVE BIOGEL PI INDICATOR 7.5 (GLOVE) ×2
GLOVE ECLIPSE 7.5 STRL STRAW (GLOVE) ×2 IMPLANT
GOWN STRL REUS W/TWL LRG LVL3 (GOWN DISPOSABLE) ×6 IMPLANT
KIT ABG SYR 3ML LUER SLIP (SYRINGE) IMPLANT
NEEDLE HYPO 25X5/8 SAFETYGLIDE (NEEDLE) IMPLANT
NS IRRIG 1000ML POUR BTL (IV SOLUTION) ×2 IMPLANT
PACK C SECTION WH (CUSTOM PROCEDURE TRAY) ×2 IMPLANT
PAD OB MATERNITY 4.3X12.25 (PERSONAL CARE ITEMS) ×2 IMPLANT
PENCIL SMOKE EVAC W/HOLSTER (ELECTROSURGICAL) ×2 IMPLANT
RTRCTR C-SECT PINK 25CM LRG (MISCELLANEOUS) ×2 IMPLANT
STRIP CLOSURE SKIN 1/2X4 (GAUZE/BANDAGES/DRESSINGS) ×2 IMPLANT
SUT MNCRL 0 VIOLET CTX 36 (SUTURE) ×2 IMPLANT
SUT MONOCRYL 0 CTX 36 (SUTURE) ×2
SUT PLAIN 2 0 (SUTURE) ×1
SUT PLAIN ABS 2-0 CT1 27XMFL (SUTURE) ×1 IMPLANT
SUT VIC AB 0 CTX 36 (SUTURE) ×3
SUT VIC AB 0 CTX36XBRD ANBCTRL (SUTURE) ×3 IMPLANT
SUT VIC AB 2-0 CT1 27 (SUTURE) ×1
SUT VIC AB 2-0 CT1 TAPERPNT 27 (SUTURE) ×1 IMPLANT
SUT VIC AB 4-0 KS 27 (SUTURE) ×2 IMPLANT
TOWEL OR 17X24 6PK STRL BLUE (TOWEL DISPOSABLE) ×2 IMPLANT
TRAY FOLEY CATH SILVER 14FR (SET/KITS/TRAYS/PACK) ×2 IMPLANT

## 2016-01-25 NOTE — Progress Notes (Signed)
Gabrielle Edwards is a 25 y.o. G2P1001 at 4039w1d admitted for induction of labor due to Gestational diabetes.  Subjective: Pit off for variables and mild fetal tachycardia for > 2 hours.   Objective: BP 116/73 mmHg  Pulse 87  Temp(Src) 99.8 F (37.7 C) (Oral)  Resp 20  Ht 5' (1.524 m)  Wt 210 lb (95.255 kg)  BMI 41.01 kg/m2  SpO2 99%  LMP 04/26/2015 I/O last 3 completed shifts: In: -  Out: 1450 [Urine:1450] Total I/O In: -  Out: 1475 [Urine:1475]  FHT:  FHR: 165 bpm, variability: minimal ,  accelerations:  Abscent,  decelerations:  Present Shallow variables within the last hour but improved.  UC:   regular, every 3-5 minutes w/ Pit  SVE:   Dilation: 8.5 Effacement (%): 90 Station: -1 Exam by:: Lorretta Harp. Brown RNC  AROm forebag, light mec  Labs: Lab Results  Component Value Date   WBC 13.5* 01/24/2016   HGB 13.3 01/24/2016   HCT 38.9 01/24/2016   MCV 80.9 01/24/2016   PLT 165 01/24/2016   CBG: 71  Assessment / Plan: IUP@term  GDM A2 Active labor- continue pitocin  #Suspected Triple I: Fetal tachycardia, maternal fever and internal warmth - start amp/gent - tylenol prn - plan to sent placenta to path  Federico FlakeKimberly Niles Mozella Rexrode CNM 01/25/2016, 3:48 PM

## 2016-01-25 NOTE — Progress Notes (Signed)
Pt stated put the bed back together she did not want to push anymore now. Dr. Alvester MorinNewton notified that patient is laboring down.

## 2016-01-25 NOTE — Progress Notes (Signed)
ANTIBIOTIC CONSULT NOTE - INITIAL  Pharmacy Consult for Gentamicin Indication: Chorioamnionitis   No Known Allergies  Patient Measurements: Height: 5' (152.4 cm) Weight: 210 lb (95.255 kg) IBW/kg (Calculated) : 45.5 Adjusted Body Weight: 60.4  Vital Signs: Temp: 99.8 F (37.7 C) (06/29 1530) Temp Source: Oral (06/29 1530) BP: 116/73 mmHg (06/29 1530) Pulse Rate: 87 (06/29 1530)  Labs:  Recent Labs  01/24/16 0845  WBC 13.5*  HGB 13.3  PLT 165   No results for input(s): GENTTROUGH, GENTPEAK, GENTRANDOM in the last 72 hours.   Microbiology: Recent Results (from the past 720 hour(s))  OB RESULT CONSOLE Group B Strep     Status: None   Collection Time: 01/02/16 12:00 AM  Result Value Ref Range Status   GBS Negative  Final  Culture, beta strep (group b only)     Status: None   Collection Time: 01/02/16  8:09 AM  Result Value Ref Range Status   Organism ID, Bacteria NO GROUP B STREP (S.AGALACTIAE) ISOLATED  Final    Medications:  Ampicillin 2g IV q6h  Assessment: 25 y.o. female G2P1001 at 9871w1d  Estimated Ke = 0.308, Vd = 24.2 L  Goal of Therapy:  Gentamicin peak 6-8 mg/L and Trough < 1 mg/L  Plan:   Gentamicin 160 mg IV every 8 hrs  Check Scr with next labs if gentamicin continued. Will check gentamicin levels if continued > 72hr or clinically indicated.  Drusilla KannerGrimsley, Suzan Manon Lydia 01/25/2016,3:54 PM

## 2016-01-25 NOTE — Progress Notes (Addendum)
Gabrielle Edwards is a 25 y.o. G2P1001 at 10251w1d admitted for induction of labor due to Gestational diabetes.  Subjective: Mostly comfortable w/ epidural in place; sitting upright in bed; Pitocin going during the night  Objective: BP 126/86 mmHg  Pulse 108  Temp(Src) 98.1 F (36.7 C) (Oral)  Resp 18  Ht 5' (1.524 m)  Wt 95.255 kg (210 lb)  BMI 41.01 kg/m2  SpO2 99%  LMP 04/26/2015   Total I/O In: -  Out: 1450 [Urine:1450]  FHT:  FHR: 150s bpm, variability: moderate,  accelerations:  Present,  decelerations:  Absent UC:   regular, every 3-5 minutes w/ Pit @ 368mu/min SVE:   Dilation: 6.5 Effacement (%): 70, 80 Station: -2 Exam by:: Fransisco BeauKristy Lashley RNC   Labs: Lab Results  Component Value Date   WBC 13.5* 01/24/2016   HGB 13.3 01/24/2016   HCT 38.9 01/24/2016   MCV 80.9 01/24/2016   PLT 165 01/24/2016   CBG: 71  Assessment / Plan: IUP@term  GDM A2 Active labor  Plan to check cx q 2 hrs or sooner prn  SHAW, KIMBERLY CNM 01/25/2016, 6:55 AM

## 2016-01-25 NOTE — Progress Notes (Signed)
Patient ID: Gabrielle Edwards, female   DOB: Dec 06, 1990, 25 y.o.   MRN: [id number]  Patient assessed after approximately 2.5 hrs of pushing.  Little change in last hour.  Has a lot of moulding and patient is exhausted.  Discussed options - pt would like to proceed with cesarean delivery.  The risks of cesarean section discussed with the patient included but were not limited to: bleeding which may require transfusion or reoperation; infection which may require antibiotics; injury to bowel, bladder, ureters or other surrounding organs; injury to the fetus; need for additional procedures including hysterectomy in the event of a life-threatening hemorrhage; placental abnormalities wth subsequent pregnancies, incisional problems, thromboembolic phenomenon and other postoperative/anesthesia complications. The patient concurred with the proposed plan, giving informed written consent for the procedure.   Patient has been NPO since this morning, she will remain NPO for procedure. Anesthesia and OR aware.  Preoperative prophylactic clindamycin and gent ordered on call to the OR.  To OR when ready.  Levie HeritageJacob J Tomasa Dobransky, DO 01/25/2016 11:45 PM

## 2016-01-25 NOTE — Progress Notes (Signed)
Gabrielle Edwards is a 25 y.o. G2P1001 at 6789w1d admitted for induction of labor due to Gestational diabetes.  Subjective: Pit off for variables and mild fetal tachycardia for > 2 hours.   Objective: BP 131/85 mmHg  Pulse 83  Temp(Src) 98.5 F (36.9 C) (Oral)  Resp 16  Ht 5' (1.524 m)  Wt 210 lb (95.255 kg)  BMI 41.01 kg/m2  SpO2 99%  LMP 04/26/2015 I/O last 3 completed shifts: In: -  Out: 1450 [Urine:1450] Total I/O In: -  Out: 825 [Urine:825]  FHT:  FHR: 150s bpm, variability: moderate,  accelerations:  Present,  decelerations:  Absent UC:   regular, every 3-5 minutes w/ Pit @ 838mu/min SVE:   Dilation: 6 Effacement (%): 70 Station: -2 Exam by:: Wynonia Soursebbie Brown RNC  AROm forebag, light mec  Labs: Lab Results  Component Value Date   WBC 13.5* 01/24/2016   HGB 13.3 01/24/2016   HCT 38.9 01/24/2016   MCV 80.9 01/24/2016   PLT 165 01/24/2016   CBG: 71  Assessment / Plan: IUP@term  GDM A1 Active labor Restart pitocin and check in 4 hours.   Isa RankinKimberly Niles Adja Ruff CNM 01/25/2016, 11:58 AM

## 2016-01-25 NOTE — Progress Notes (Addendum)
Patient ID: Gabrielle Edwards, female   DOB: 07/25/1991, 25 y.o.   MRN: [id number]  Pt appears to be sleeping; not disturbed; foley out approx 1a; epidural in place  FHR 140s, +accels, no decels Ctx q 2-5 mins w/ Pit @ 742mu/min  IUP@term  A2GDM IOL progress  Will continue to increase Pitocin to achieve adequate labor  Adriauna Campton, KIMBERLYCNM 01/25/2016  0100

## 2016-01-25 NOTE — Progress Notes (Signed)
Labor Progress Note Gabrielle Edwards is a 25 y.o. G2P1001 at 495w1d presented for IOL for A2GDM. S: Says she is feeling awful because of the pain and pressure in pelvis. Epidural redosed about 10-20 minute ago.  O:  BP 142/76 mmHg  Pulse 93  Temp(Src) 99.7 F (37.6 C) (Oral)  Resp 20  Ht 5' (1.524 m)  Wt 210 lb (95.255 kg)  BMI 41.01 kg/m2  SpO2 99%  LMP 04/26/2015 EFM: 170/mod var/occ variable decels  CVE: Dilation: 10 Dilation Complete Date: 01/25/16 Dilation Complete Time: 1755 Effacement (%): 100 Cervical Position: Middle Station: +1 Presentation: Vertex Exam by:: Dr. Alvester MorinNewton   A&P: 25 y.o. G2P1001 625w1d IOL of labor for A2GDM. Patient had a temp t 100.3 about 6 pm. Started on Amp and Genta.  #Labor: poor maternal effort in second stage #Pain: epidural redosed as patient continued to complain pain #FWB: CAT-2 with baseline at 170 but with mod variability. Started on Antibiotics #GBS: neg  Almon Herculesaye T Francisca Langenderfer, MD 8:27 PM

## 2016-01-25 NOTE — Progress Notes (Signed)
Pt now willing/ready to push.  Vtx at +2 station.   FHR 165, mod variability, + accels, occ mild variable.  Dr Adrian Blackwaterstinson aware.  Will start pushing.

## 2016-01-26 ENCOUNTER — Encounter (HOSPITAL_COMMUNITY): Payer: Self-pay | Admitting: Family Medicine

## 2016-01-26 DIAGNOSIS — O324XX0 Maternal care for high head at term, not applicable or unspecified: Secondary | ICD-10-CM | POA: Diagnosis not present

## 2016-01-26 DIAGNOSIS — O24425 Gestational diabetes mellitus in childbirth, controlled by oral hypoglycemic drugs: Secondary | ICD-10-CM

## 2016-01-26 DIAGNOSIS — Z302 Encounter for sterilization: Secondary | ICD-10-CM

## 2016-01-26 DIAGNOSIS — Z3A39 39 weeks gestation of pregnancy: Secondary | ICD-10-CM

## 2016-01-26 LAB — CBC
HEMATOCRIT: 31.5 % — AB (ref 36.0–46.0)
Hemoglobin: 10.5 g/dL — ABNORMAL LOW (ref 12.0–15.0)
MCH: 27.2 pg (ref 26.0–34.0)
MCHC: 33.3 g/dL (ref 30.0–36.0)
MCV: 81.6 fL (ref 78.0–100.0)
Platelets: 161 10*3/uL (ref 150–400)
RBC: 3.86 MIL/uL — ABNORMAL LOW (ref 3.87–5.11)
RDW: 17.1 % — AB (ref 11.5–15.5)
WBC: 20.3 10*3/uL — ABNORMAL HIGH (ref 4.0–10.5)

## 2016-01-26 LAB — GLUCOSE, CAPILLARY
GLUCOSE-CAPILLARY: 129 mg/dL — AB (ref 65–99)
Glucose-Capillary: 124 mg/dL — ABNORMAL HIGH (ref 65–99)

## 2016-01-26 MED ORDER — OXYTOCIN 10 UNIT/ML IJ SOLN
INTRAMUSCULAR | Status: AC
  Filled 2016-01-26: qty 4

## 2016-01-26 MED ORDER — MEPERIDINE HCL 25 MG/ML IJ SOLN
INTRAMUSCULAR | Status: DC | PRN
  Administered 2016-01-26 (×2): 12.5 mg via INTRAVENOUS

## 2016-01-26 MED ORDER — FENTANYL CITRATE (PF) 100 MCG/2ML IJ SOLN
INTRAMUSCULAR | Status: DC | PRN
  Administered 2016-01-26: 100 ug via EPIDURAL
  Administered 2016-01-26 (×2): 50 ug via INTRAVENOUS

## 2016-01-26 MED ORDER — SIMETHICONE 80 MG PO CHEW
80.0000 mg | CHEWABLE_TABLET | ORAL | Status: DC
  Administered 2016-01-26 – 2016-01-28 (×2): 80 mg via ORAL
  Filled 2016-01-26 (×2): qty 1

## 2016-01-26 MED ORDER — MIDAZOLAM HCL 2 MG/2ML IJ SOLN: 0.5000 mg | Freq: Once | INTRAMUSCULAR | Status: DC | PRN

## 2016-01-26 MED ORDER — NALBUPHINE HCL 10 MG/ML IJ SOLN: 5.0000 mg | INTRAMUSCULAR | Status: DC | PRN

## 2016-01-26 MED ORDER — FENTANYL CITRATE (PF) 100 MCG/2ML IJ SOLN
INTRAMUSCULAR | Status: AC
  Filled 2016-01-26: qty 2

## 2016-01-26 MED ORDER — NALOXONE HCL 0.4 MG/ML IJ SOLN: 0.4000 mg | INTRAMUSCULAR | Status: DC | PRN

## 2016-01-26 MED ORDER — DIPHENHYDRAMINE HCL 25 MG PO CAPS
25.0000 mg | ORAL_CAPSULE | Freq: Four times a day (QID) | ORAL | Status: DC | PRN
  Filled 2016-01-26: qty 1

## 2016-01-26 MED ORDER — SCOPOLAMINE 1 MG/3DAYS TD PT72
1.0000 | MEDICATED_PATCH | Freq: Once | TRANSDERMAL | Status: DC
  Filled 2016-01-26: qty 1

## 2016-01-26 MED ORDER — CLINDAMYCIN PHOSPHATE 900 MG/50ML IV SOLN
INTRAVENOUS | Status: DC | PRN
  Administered 2016-01-26: 900 mg via INTRAVENOUS

## 2016-01-26 MED ORDER — DIPHENHYDRAMINE HCL 50 MG/ML IJ SOLN: 12.5000 mg | INTRAMUSCULAR | Status: DC | PRN

## 2016-01-26 MED ORDER — KETOROLAC TROMETHAMINE 30 MG/ML IJ SOLN
30.0000 mg | Freq: Four times a day (QID) | INTRAMUSCULAR | Status: AC | PRN
  Administered 2016-01-26: 30 mg via INTRAMUSCULAR

## 2016-01-26 MED ORDER — MEPERIDINE HCL 25 MG/ML IJ SOLN
INTRAMUSCULAR | Status: AC
  Filled 2016-01-26: qty 1

## 2016-01-26 MED ORDER — KETOROLAC TROMETHAMINE 30 MG/ML IJ SOLN: 30.0000 mg | Freq: Four times a day (QID) | INTRAMUSCULAR | Status: AC | PRN

## 2016-01-26 MED ORDER — PRENATAL MULTIVITAMIN CH
1.0000 | ORAL_TABLET | Freq: Every day | ORAL | Status: DC
  Administered 2016-01-26 – 2016-01-28 (×3): 1 via ORAL
  Filled 2016-01-26 (×3): qty 1

## 2016-01-26 MED ORDER — TETANUS-DIPHTH-ACELL PERTUSSIS 5-2.5-18.5 LF-MCG/0.5 IM SUSP: 0.5000 mL | Freq: Once | INTRAMUSCULAR | Status: DC

## 2016-01-26 MED ORDER — MENTHOL 3 MG MT LOZG: 1.0000 | LOZENGE | OROMUCOSAL | Status: DC | PRN

## 2016-01-26 MED ORDER — ONDANSETRON HCL 4 MG/2ML IJ SOLN
INTRAMUSCULAR | Status: DC | PRN
  Administered 2016-01-26: 4 mg via INTRAVENOUS

## 2016-01-26 MED ORDER — SENNOSIDES-DOCUSATE SODIUM 8.6-50 MG PO TABS
2.0000 | ORAL_TABLET | ORAL | Status: DC
  Administered 2016-01-26 – 2016-01-28 (×2): 2 via ORAL
  Filled 2016-01-26 (×2): qty 2

## 2016-01-26 MED ORDER — OXYCODONE-ACETAMINOPHEN 5-325 MG PO TABS
1.0000 | ORAL_TABLET | ORAL | Status: DC | PRN
Start: 2016-01-26 — End: 2016-01-28
  Administered 2016-01-27 – 2016-01-28 (×2): 1 via ORAL
  Filled 2016-01-26: qty 1

## 2016-01-26 MED ORDER — SODIUM BICARBONATE 8.4 % IV SOLN
INTRAVENOUS | Status: DC | PRN
  Administered 2016-01-25 (×2): 5 mL via EPIDURAL
  Administered 2016-01-26: 3 mL via EPIDURAL

## 2016-01-26 MED ORDER — SODIUM CHLORIDE 0.9% FLUSH: 3.0000 mL | INTRAVENOUS | Status: DC | PRN

## 2016-01-26 MED ORDER — SCOPOLAMINE 1 MG/3DAYS TD PT72
MEDICATED_PATCH | TRANSDERMAL | Status: AC
  Filled 2016-01-26: qty 1

## 2016-01-26 MED ORDER — KETOROLAC TROMETHAMINE 30 MG/ML IJ SOLN
INTRAMUSCULAR | Status: AC
  Filled 2016-01-26: qty 1

## 2016-01-26 MED ORDER — COCONUT OIL OIL: 1.0000 "application " | TOPICAL_OIL | Status: DC | PRN

## 2016-01-26 MED ORDER — OXYTOCIN 10 UNIT/ML IJ SOLN
40.0000 [IU] | INTRAMUSCULAR | Status: DC | PRN
  Administered 2016-01-26: 40 [IU] via INTRAVENOUS

## 2016-01-26 MED ORDER — MORPHINE SULFATE (PF) 4 MG/ML IV SOLN: 1.0000 mg | INTRAVENOUS | Status: DC | PRN

## 2016-01-26 MED ORDER — WITCH HAZEL-GLYCERIN EX PADS: 1.0000 "application " | MEDICATED_PAD | CUTANEOUS | Status: DC | PRN

## 2016-01-26 MED ORDER — DIBUCAINE 1 % RE OINT: 1.0000 "application " | TOPICAL_OINTMENT | RECTAL | Status: DC | PRN

## 2016-01-26 MED ORDER — MEPERIDINE HCL 25 MG/ML IJ SOLN: 6.2500 mg | INTRAMUSCULAR | Status: DC | PRN

## 2016-01-26 MED ORDER — GENTAMICIN SULFATE 40 MG/ML IJ SOLN
Freq: Once | INTRAVENOUS | Status: AC
  Administered 2016-01-26: 16:00:00 via INTRAVENOUS
  Filled 2016-01-26: qty 4

## 2016-01-26 MED ORDER — ACETAMINOPHEN 160 MG/5ML PO SOLN
ORAL | Status: AC
  Filled 2016-01-26: qty 20.3

## 2016-01-26 MED ORDER — LACTATED RINGERS IV SOLN
INTRAVENOUS | Status: DC | PRN
  Administered 2016-01-26 (×2): via INTRAVENOUS

## 2016-01-26 MED ORDER — OXYCODONE-ACETAMINOPHEN 5-325 MG PO TABS
2.0000 | ORAL_TABLET | ORAL | Status: DC | PRN
  Administered 2016-01-27 – 2016-01-28 (×5): 2 via ORAL
  Filled 2016-01-26 (×6): qty 2

## 2016-01-26 MED ORDER — LACTATED RINGERS IV SOLN: INTRAVENOUS | Status: DC

## 2016-01-26 MED ORDER — ACETAMINOPHEN 500 MG PO TABS
1000.0000 mg | ORAL_TABLET | Freq: Four times a day (QID) | ORAL | Status: DC | PRN
  Administered 2016-01-26 – 2016-01-27 (×3): 1000 mg via ORAL
  Filled 2016-01-26 (×2): qty 2

## 2016-01-26 MED ORDER — SIMETHICONE 80 MG PO CHEW
80.0000 mg | CHEWABLE_TABLET | Freq: Three times a day (TID) | ORAL | Status: DC
  Administered 2016-01-26 – 2016-01-28 (×8): 80 mg via ORAL
  Filled 2016-01-26 (×7): qty 1

## 2016-01-26 MED ORDER — GENTAMICIN SULFATE 40 MG/ML IJ SOLN
160.0000 mg | Freq: Three times a day (TID) | INTRAMUSCULAR | Status: DC
  Administered 2016-01-26: 160 mg via INTRAVENOUS
  Filled 2016-01-26 (×2): qty 4

## 2016-01-26 MED ORDER — NALBUPHINE HCL 10 MG/ML IJ SOLN: 5.0000 mg | Freq: Once | INTRAMUSCULAR | Status: DC | PRN

## 2016-01-26 MED ORDER — CHLOROPROCAINE HCL (PF) 3 % IJ SOLN
INTRAMUSCULAR | Status: AC
  Filled 2016-01-26: qty 20

## 2016-01-26 MED ORDER — ONDANSETRON HCL 4 MG/2ML IJ SOLN
INTRAMUSCULAR | Status: AC
  Filled 2016-01-26: qty 2

## 2016-01-26 MED ORDER — IBUPROFEN 600 MG PO TABS
600.0000 mg | ORAL_TABLET | Freq: Four times a day (QID) | ORAL | Status: DC
  Administered 2016-01-26 – 2016-01-28 (×11): 600 mg via ORAL
  Filled 2016-01-26 (×11): qty 1

## 2016-01-26 MED ORDER — SODIUM CHLORIDE 0.9 % IV SOLN
2.0000 g | Freq: Four times a day (QID) | INTRAVENOUS | Status: AC
  Administered 2016-01-26 (×4): 2 g via INTRAVENOUS
  Filled 2016-01-26 (×5): qty 2000

## 2016-01-26 MED ORDER — ACETAMINOPHEN 500 MG PO TABS
ORAL_TABLET | ORAL | Status: AC
  Filled 2016-01-26: qty 2

## 2016-01-26 MED ORDER — ACETAMINOPHEN 325 MG PO TABS
650.0000 mg | ORAL_TABLET | ORAL | Status: DC | PRN
  Filled 2016-01-26: qty 2

## 2016-01-26 MED ORDER — NALOXONE HCL 2 MG/2ML IJ SOSY
1.0000 ug/kg/h | PREFILLED_SYRINGE | INTRAVENOUS | Status: DC | PRN
  Filled 2016-01-26: qty 2

## 2016-01-26 MED ORDER — CLINDAMYCIN PHOSPHATE 900 MG/50ML IV SOLN
900.0000 mg | Freq: Three times a day (TID) | INTRAVENOUS | Status: DC
  Administered 2016-01-26: 900 mg via INTRAVENOUS
  Filled 2016-01-26 (×2): qty 50

## 2016-01-26 MED ORDER — ZOLPIDEM TARTRATE 5 MG PO TABS: 5.0000 mg | ORAL_TABLET | Freq: Every evening | ORAL | Status: DC | PRN

## 2016-01-26 MED ORDER — MORPHINE SULFATE (PF) 0.5 MG/ML IJ SOLN
INTRAMUSCULAR | Status: DC | PRN
  Administered 2016-01-26: .5 mg via EPIDURAL
  Administered 2016-01-26: 4 mg via EPIDURAL
  Administered 2016-01-26: .5 mg via INTRAVENOUS

## 2016-01-26 MED ORDER — PROMETHAZINE HCL 25 MG/ML IJ SOLN: 6.2500 mg | INTRAMUSCULAR | Status: DC | PRN

## 2016-01-26 MED ORDER — SIMETHICONE 80 MG PO CHEW
80.0000 mg | CHEWABLE_TABLET | ORAL | Status: DC | PRN
  Administered 2016-01-27: 80 mg via ORAL
  Filled 2016-01-26: qty 1

## 2016-01-26 MED ORDER — ONDANSETRON HCL 4 MG/2ML IJ SOLN: 4.0000 mg | Freq: Three times a day (TID) | INTRAMUSCULAR | Status: DC | PRN

## 2016-01-26 MED ORDER — OXYTOCIN 40 UNITS IN LACTATED RINGERS INFUSION - SIMPLE MED
2.5000 [IU]/h | INTRAVENOUS | Status: AC
Start: 2016-01-26 — End: 2016-01-26

## 2016-01-26 MED ORDER — DIPHENHYDRAMINE HCL 25 MG PO CAPS
25.0000 mg | ORAL_CAPSULE | ORAL | Status: DC | PRN
  Administered 2016-01-26: 25 mg via ORAL

## 2016-01-26 MED ORDER — SCOPOLAMINE 1 MG/3DAYS TD PT72
MEDICATED_PATCH | TRANSDERMAL | Status: DC | PRN
  Administered 2016-01-26: 1 via TRANSDERMAL

## 2016-01-26 NOTE — Op Note (Signed)
Gabrielle Edwards PROCEDURE DATE: 01/24/2016 - 01/26/2016  PREOPERATIVE DIAGNOSIS: Intrauterine pregnancy at  266w2d weeks gestation; failure to progress: arrest of descent, undesired fertility  POSTOPERATIVE DIAGNOSIS: The same  PROCEDURE: Primary Low Transverse Cesarean Section, bilateral tubal sterilization  SURGEON:  Dr. Candelaria CelesteJacob Florette Thai  ASSISTANT: Emilio MathLeanne Carpenter RNFA  INDICATIONS: Gabrielle Edwards is a 25 y.o. G2P1001 at 4666w2d scheduled for cesarean section secondary to failure to progress: arrest of descent.  The risks of cesarean section discussed with the patient included but were not limited to: bleeding which may require transfusion or reoperation; infection which may require antibiotics; injury to bowel, bladder, ureters or other surrounding organs; injury to the fetus; need for additional procedures including hysterectomy in the event of a life-threatening hemorrhage; placental abnormalities wth subsequent pregnancies, incisional problems, thromboembolic phenomenon and other postoperative/anesthesia complications. The patient concurred with the proposed plan, giving informed written consent for the procedure.    FINDINGS:  Viable female infant in vertex, OP presentation.  Apgars 7 and 7, weight pending.  Meconium stained amniotic fluid.  Intact placenta, three vessel cord.  Normal uterus, fallopian tubes and ovaries bilaterally.  ANESTHESIA:    Spinal INTRAVENOUS FLUIDS:1200 ml ESTIMATED BLOOD LOSS: 800 ml URINE OUTPUT:  75 ml SPECIMENS: Placenta sent to pathology COMPLICATIONS: None immediate  PROCEDURE IN DETAIL:  The patient received intravenous antibiotics and had sequential compression devices applied to her lower extremities while in the preoperative area.  She was then taken to the operating room where epidural anesthesia was dosed up to surgical level and was found to be adequate. She was then placed in a dorsal supine position with a leftward tilt, and prepped and draped in a  sterile manner.  A foley catheter had been previously placed into her bladder and attached to constant gravity, which drained clear fluid throughout.  After an adequate timeout was performed, a Pfannenstiel skin incision was made with scalpel and carried through to the underlying layer of fascia. The fascia was incised in the midline and this incision was extended bilaterally using the Mayo scissors. Kocher clamps were applied to the superior aspect of the fascial incision and the underlying rectus muscles were dissected off bluntly. A similar process was carried out on the inferior aspect of the facial incision. The rectus muscles were separated in the midline bluntly and the peritoneum was entered bluntly. In order to improve visualization, the rectus muscles were transected in the midline by 1cm with cautery.  An Alexis retractor was placed to aid in visualization of the uterus.  Attention was turned to the lower uterine segment where a transverse hysterotomy was made with a scalpel and extended bilaterally bluntly. The infant was successfully delivered, and cord was clamped and cut and infant was handed over to awaiting neonatology team. Uterine massage was then administered and the placenta delivered intact with three-vessel cord. The uterus was then cleared of clot and debris.  The hysterotomy was closed with 0 monocryl in a running locked fashion, and an imbricating layer was also placed with a 0 monncryl. Overall, excellent hemostasis was noted.  Attention was then turned to the fallopian tubes.  A Filshie clip was placed on both tubes, about 2 cm from the cornua, with care given to incorporate the underlying mesosalpinx on both sides, allowing for bilateral tubal sterilization.  The abdomen and the pelvis were cleared of all clot and debris and the Jon Gillslexis was removed. Hemostasis was confirmed on all surfaces.  The peritoneum and rectus muscles was reapproximated  using 2-0 vicryl stitches. The fascia was  then closed using 0 Vicryl in a running fashion.  The subcutaneous layer was reapproximated with plain gut and the skin was closed with 4-0 vicryl. The patient tolerated the procedure well. Sponge, lap, instrument and needle counts were correct x 2. She was taken to the recovery room in stable condition.    Levie HeritageJacob J Shaylee Stanislawski, DO 01/26/2016 1:26 AM

## 2016-01-26 NOTE — Anesthesia Postprocedure Evaluation (Signed)
Anesthesia Post Note  Patient: Gabrielle Edwards  Procedure(s) Performed: Procedure(s) (LRB): CESAREAN SECTION (N/A)  Patient location during evaluation: PACU Anesthesia Type: Epidural Level of consciousness: oriented, patient cooperative and awake and alert Pain management: pain level controlled Vital Signs Assessment: post-procedure vital signs reviewed and stable Respiratory status: spontaneous breathing, nonlabored ventilation and respiratory function stable Cardiovascular status: blood pressure returned to baseline and stable Postop Assessment: no headache, no backache, epidural receding and patient able to bend at knees Anesthetic complications: no     Last Vitals:  Filed Vitals:   01/26/16 0152 01/26/16 0153  BP:    Pulse: 87 84  Temp:    Resp: 16 15    Last Pain:  Filed Vitals:   01/26/16 0159  PainSc: 0-No pain   Pain Goal:                 Kannan Proia,E. Talajah Slimp

## 2016-01-26 NOTE — Progress Notes (Signed)
UR chart review completed.  

## 2016-01-26 NOTE — Progress Notes (Signed)
Got mother started on DEBP and she was doing it in the room. Mother taught how to do it.  Infant in NICU

## 2016-01-26 NOTE — Lactation Note (Signed)
This note was copied from a baby's chart. Lactation Consultation Note  Patient Name: Boy Beckey Downingvy Hribar RUEAV'WToday's Date: 01/26/2016 Reason for consult: Initial assessment;NICU baby  NICU baby 5316 hours old. Mom has colostrum at bedside, but states that she intends to rest. Discussed EBM storage guidelines and enc mom to always call her nurse to refrigerate the EBM as needed. Transferred colostrum to a colostrum container, and patient's bedside nurse, Victorino DikeJennifer, RN is at bedside and states that she will label and refrigerate colostrum. Enc mom to pump 8 times/24 hours for 15 minutes followed by hand expression. Mom states that she hopes she will be able to put the baby to breast. Enc mom to visit baby in NICU and offer STS and nuzzling at breast as she and baby able. Mom states that she is not able to go to NICU now. Enc mom to rest and go as she is able. Mom aware of OP/BFSG and LC phone line assistance after D/C.  Maternal Data Has patient been taught Hand Expression?: Yes (Per mom.) Does the patient have breastfeeding experience prior to this delivery?: Yes  Feeding Feeding Type: Formula Nipple Type: Slow - flow Length of feed: 5 min  LATCH Score/Interventions                      Lactation Tools Discussed/Used Pump Review: Setup, frequency, and cleaning;Milk Storage Initiated by:: bedside nurse Date initiated:: 01/25/16   Consult Status Consult Status: Follow-up Date: 01/27/16 Follow-up type: In-patient    Geralynn OchsWILLIARD, Shereen Marton 01/26/2016, 5:39 PM

## 2016-01-26 NOTE — Progress Notes (Signed)
Dr. Ashok PallWouk called to verify if he wanted all three Antibiotics discontinued after 24 hours. He stated he did.

## 2016-01-26 NOTE — Progress Notes (Signed)
Mom arrived back from NICU states she still feels dizzy when standing up at times. Instructed to call nurse when getting up.

## 2016-01-26 NOTE — Transfer of Care (Signed)
Immediate Anesthesia Transfer of Care Note  Patient: Delena Balivy Claire Huettner  Procedure(s) Performed: Procedure(s): CESAREAN SECTION (N/A)  Patient Location: PACU  Anesthesia Type:Epidural  Level of Consciousness: awake, alert  and oriented  Airway & Oxygen Therapy: Patient Spontanous Breathing  Post-op Assessment: Report given to RN and Post -op Vital signs reviewed and stable  Post vital signs: Reviewed and stable  Last Vitals:  Filed Vitals:   01/26/16 0001 01/26/16 0002  BP: 125/74 117/66  Pulse: 94 99  Temp:    Resp:      Last Pain:  Filed Vitals:   01/26/16 0112  PainSc: 5          Complications: No apparent anesthesia complications

## 2016-01-26 NOTE — Anesthesia Postprocedure Evaluation (Signed)
Anesthesia Post Note  Patient: Gabrielle Edwards  Procedure(s) Performed: Procedure(s) (LRB): CESAREAN SECTION (N/A)  Patient location during evaluation: Mother Baby Anesthesia Type: Epidural Level of consciousness: awake Pain management: pain level controlled Vital Signs Assessment: post-procedure vital signs reviewed and stable Respiratory status: spontaneous breathing Cardiovascular status: stable Postop Assessment: no headache, no backache, epidural receding and patient able to bend at knees Anesthetic complications: no     Last Vitals:  Filed Vitals:   01/26/16 1128 01/26/16 1129  BP: 102/56 78/48  Pulse: 67 80  Temp: 36.9 C   Resp: 18 18    Last Pain:  Filed Vitals:   01/26/16 1234  PainSc: 2    Pain Goal:                 [name],[name]

## 2016-01-27 LAB — CBC
HCT: 30 % — ABNORMAL LOW (ref 36.0–46.0)
HEMOGLOBIN: 9.8 g/dL — AB (ref 12.0–15.0)
MCH: 27.2 pg (ref 26.0–34.0)
MCHC: 32.7 g/dL (ref 30.0–36.0)
MCV: 83.3 fL (ref 78.0–100.0)
PLATELETS: 156 10*3/uL (ref 150–400)
RBC: 3.6 MIL/uL — AB (ref 3.87–5.11)
RDW: 17.6 % — ABNORMAL HIGH (ref 11.5–15.5)
WBC: 15.4 10*3/uL — AB (ref 4.0–10.5)

## 2016-01-27 LAB — GLUCOSE, RANDOM: GLUCOSE: 108 mg/dL — AB (ref 65–99)

## 2016-01-27 LAB — GLUCOSE, CAPILLARY: Glucose-Capillary: 105 mg/dL — ABNORMAL HIGH (ref 65–99)

## 2016-01-27 NOTE — Progress Notes (Addendum)
Subjective: Postpartum Day #1: Cesarean Delivery & BTL Patient reports tolerating PO, + flatus and no problems voiding. Pumping/breastfeeding. Infant in NICU for hypoglycemic issues. S/p Amp/Gent x 24hrs PP. Last T 100.5 on 6/30 at 0300.  Objective: Vital signs in last 24 hours: Temp:  [98.2 F (36.8 C)-98.7 F (37.1 C)] 98.7 F (37.1 C) (07/01 0522) Pulse Rate:  [66-83] 83 (07/01 0522) Resp:  [18] 18 (07/01 0522) BP: (78-136)/(48-70) 136/70 mmHg (07/01 0522) SpO2:  [96 %-98 %] 98 % (06/30 1930)  Physical Exam:  General: alert, cooperative and no distress Lochia: appropriate Uterine Fundus: firm Incision: honeycomb intact; marked and unchanged DVT Evaluation: No evidence of DVT seen on physical exam.   Recent Labs  01/26/16 0539  HGB 10.5*  HCT 31.5*   FBS: 105  Assessment/Plan: Status post Cesarean section. Doing well postoperatively.  Continue current care. Anticipate d/c on POD#3 due to NICU infant. CBC ordered for this morning. FBS discontinued.  Gabrielle Edwards, Gabrielle Edwards CNM 01/27/2016, 8:53 AM

## 2016-01-28 MED ORDER — OXYCODONE-ACETAMINOPHEN 5-325 MG PO TABS: 2.0000 | ORAL_TABLET | ORAL | Status: DC | PRN

## 2016-01-28 MED ORDER — IBUPROFEN 800 MG PO TABS: 800.0000 mg | ORAL_TABLET | Freq: Three times a day (TID) | ORAL | Status: AC | PRN

## 2016-01-28 NOTE — Lactation Note (Signed)
This note was copied from a baby's chart. Lactation Consultation Note  Patient Name: Boy Beckey Downingvy Kerrigan WUJWJ'XToday's Date: 01/28/2016 Reason for consult: Follow-up assessment;NICU baby (posssible D/C later today for just mom )  Per mom has only pumped a few tines in the last 24 and has got'en more milk hand expressing.  LC reviewed supply and demand and the importance of being consistent with both to enhance flow.  LC encourage mom to call on nurses light and ask for LC so her flange size can be assessed.  Per mom doesn't have a DEBP at home . [facility] and paperwork given with instruction.    Maternal Data    Feeding Feeding Type: Formula Nipple Type: Slow - flow Length of feed: 30 min  LATCH Score/Interventions                Intervention(s): Breastfeeding basics reviewed     Lactation Tools Discussed/Used Tools: Pump Breast pump type: Double-Electric Breast Pump (encouraged to pump every 2-3 hours ) WIC Program: Yes (per mom Active WIC [place] )   Consult Status Consult Status: Follow-up (encouraged to call when ready to pump so LC can assess flange ) Date: 01/28/16 Follow-up type: In-patient    Kathrin Greathouseorio, Talita Recht Ann 01/28/2016, 9:58 AM

## 2016-01-28 NOTE — Discharge Summary (Signed)
OB Discharge Summary     Patient Name: Gabrielle Edwards DOB: [birth year] MRN: [id number]  Date of admission: 01/24/2016 Delivering MD: Truett Mainland   Date of discharge: 01/28/2016  Admitting diagnosis: INDUCTION Intrauterine pregnancy: [redacted]w[redacted]d    Secondary diagnosis:  Active Problems:   Gestational diabetes  Additional problems: Macrosomia      Discharge diagnosis: Term Pregnancy Delivered and GDM A2                                                                                                Post partum procedures:postpartum tubal ligation  Augmentation: none  Complications: None  Hospital course:  Induction of Labor With Cesarean Section  25y.o. yo G2P2002 at 353w2das admitted to the hospital 01/24/2016 for induction of labor. Patient had a labor course significant for arrest of descent. The patient went for cesarean section due to Arrest of Descent, and delivered a Viable infant,@BABYSUPPRESS (DBLINK,ept,110,,1,,) Membrane Rupture Time/Date: )1:30 PM ,01/24/2016   @Details  of operation can be found in separate operative Note.  Patient had an uncomplicated postpartum course. She is ambulating, tolerating a regular diet, passing flatus, and urinating well.  Patient is discharged home in stable condition on 01/28/2016.                                     Physical exam  Filed Vitals:   01/26/16 1605 01/26/16 1930 01/27/16 0522 01/27/16 1700  BP: 103/51  136/70 125/60  Pulse: 66  83   Temp: 98.2 F (36.8 C)  98.7 F (37.1 C) 97.8 F (36.6 C)  TempSrc: Oral     Resp: 18  18   Height:      Weight:      SpO2: 98% 98%     General: alert, cooperative and no distress Lochia: appropriate Uterine Fundus: firm Incision: Healing well with no significant drainage DVT Evaluation: No evidence of DVT seen on physical exam. Labs: Lab Results  Component Value Date   WBC 15.4* 01/27/2016   HGB 9.8* 01/27/2016   HCT 30.0* 01/27/2016   MCV 83.3 01/27/2016   PLT 156 01/27/2016    CMP Latest Ref Rng 01/27/2016  Glucose 65 - 99 mg/dL 108(H)    Discharge instruction: per After Visit Summary and "Baby and Me Booklet".  After visit meds:    Medication List    TAKE these medications        ACCU-CHEK AVIVA PLUS w/Device Kit  1 Device by Does not apply route as directed.     acetaminophen 500 MG tablet  Commonly known as:  TYLENOL  Take 1,000 mg by mouth every 6 (six) hours as needed for mild pain or headache.     glucose blood test strip  Commonly known as:  ACCU-CHEK AVIVA  Check blood sugars 4 times a day     glyBURIDE 2.5 MG tablet  Commonly known as:  DIABETA  Take 2 tablets (5 mg total) by mouth daily with breakfast.     ibuprofen 800 MG  tablet  Commonly known as:  ADVIL,MOTRIN  Take 1 tablet (800 mg total) by mouth every 8 (eight) hours as needed.     oxyCODONE-acetaminophen 5-325 MG tablet  Commonly known as:  PERCOCET/ROXICET  Take 2 tablets by mouth every 4 (four) hours as needed for severe pain.     Prenatal Vitamins 0.8 MG tablet  Take 1 tablet by mouth daily.        Diet: routine diet  Activity: Advance as tolerated. Pelvic rest for 6 weeks.   Outpatient follow up:6 weeks Follow up Appt:Future Appointments Date Time Provider [place]  03/12/2016 2:40 PM Luvenia Redden, PA-C [facility]   Follow up Visit:No Follow-up on file.  Postpartum contraception: Tubal Ligation  Newborn Data: Live born female  Birth Weight: 8 lb 4.8 oz (3765 g) APGAR: 7, 7  Baby Feeding: Breast Disposition:home with mother   01/28/2016 Mathis Bud, CNM

## 2016-01-28 NOTE — Lactation Note (Signed)
Tried a 24 nipple shield patient reports that it feels better than the 27. DEBP reviewd

## 2016-01-28 NOTE — Lactation Note (Signed)
This note was copied from a baby's chart. Lactation Consultation Note  Patient Name: Gabrielle Edwards  2nd LC visit today - per mom had pumped with RN checking the flanges  [facility]. And per mom the #24 were comfortable but still  Not getting anything. LC reassured mom if she is consistent with pumping every 2-3 hours  Both breast together and hand expressing increases potential for milk letting down.  Encouraged plenty of fluids and nutritious meals. Plan on pumping when visiting abby in NICU.  [facility] received and [facility]. Mother informed of post-discharge support and given phone number to the lactation department, including services for phone call assistance; out-patient appointments; and breastfeeding support group. List of other breastfeeding resources in the community given in the handout. Encouraged mother to call for problems or concerns related to breastfeeding.   Maternal Data    Feeding Feeding Type: Formula Nipple Type: Slow - flow Length of feed: 30 min  LATCH Score/Interventions                      Lactation Tools Discussed/Used     Consult Status      Gabrielle Edwards, Gabrielle Edwards Edwards, 5:27 PM

## 2016-03-12 ENCOUNTER — Ambulatory Visit (INDEPENDENT_AMBULATORY_CARE_PROVIDER_SITE_OTHER): Payer: Medicaid Other | Admitting: Medical

## 2016-03-12 ENCOUNTER — Encounter: Payer: Self-pay | Admitting: Medical

## 2016-03-12 DIAGNOSIS — Z8632 Personal history of gestational diabetes: Secondary | ICD-10-CM | POA: Insufficient documentation

## 2016-03-12 NOTE — Patient Instructions (Signed)

## 2016-03-12 NOTE — Progress Notes (Signed)
Subjective:     Gabrielle Edwards is a 25 y.o. female who presents for a postpartum visit. She is 6 weeks postpartum following a LTCS. I have fully reviewed the prenatal and intrapartum course. The delivery was at 39.2 gestational weeks. Outcome: LTCS Anesthesia: Spinal. Postpartum course has been uncomplicated. Baby's course has been uncomplicated. Baby is feeding by breast and bottle. Bleeding scant . Bowel function is normal. Bladder function is normal. Patient is sexually active. Contraception method is Tubal Ligation. Postpartum depression screening: negative   The following portions of the patient's history were reviewed and updated as appropriate: allergies, current medications, past family history, past medical history, past social history, past surgical history and problem list.  Review of Systems Pertinent items are noted in HPI.   Objective:    BP 136/66   Pulse 89   Wt 190 lb 9.6 oz (86.5 kg)   BMI 37.22 kg/m   General:  alert and cooperative   Breasts:  not performed  Lungs: normal effort  Heart:  normal effort  Abdomen: soft, non-tender, incision is well-healed without surrounding erythema, drainage or bleeding   Vulva:  not evaluated  Vagina: not evaluated  Cervix:  not evaluated  Corpus: not examined  Adnexa:  not evaluated  Rectal Exam: Not performed.        Assessment:     Normal postpartum exam. Pap smear not done at today's visit.   Plan:    1. Contraception: tubal ligation 2. Follow up in: 2 weeks 2 hour GTT or sooner as needed.    Marny LowensteinJulie N Nagee Goates, PA-C 03/12/2016 3:51 PM

## 2016-03-15 ENCOUNTER — Other Ambulatory Visit: Payer: Medicaid Other

## 2018-01-27 ENCOUNTER — Encounter (HOSPITAL_COMMUNITY): Payer: Self-pay

## 2018-01-27 ENCOUNTER — Emergency Department (HOSPITAL_COMMUNITY)
Admission: EM | Admit: 2018-01-27 | Discharge: 2018-01-27 | Disposition: A | Discharge status: Home / Self Care | Payer: 59 | Attending: Emergency Medicine | Admitting: Emergency Medicine

## 2018-01-27 DIAGNOSIS — R070 Pain in throat: Secondary | ICD-10-CM | POA: Diagnosis not present

## 2018-01-27 DIAGNOSIS — J029 Acute pharyngitis, unspecified: Secondary | ICD-10-CM | POA: Diagnosis not present

## 2018-01-27 DIAGNOSIS — R000 Tachycardia, unspecified: Secondary | ICD-10-CM | POA: Diagnosis not present

## 2018-01-27 DIAGNOSIS — R42 Dizziness and giddiness: Secondary | ICD-10-CM | POA: Diagnosis not present

## 2018-01-27 DIAGNOSIS — R509 Fever, unspecified: Secondary | ICD-10-CM | POA: Diagnosis not present

## 2018-01-27 DIAGNOSIS — R61 Generalized hyperhidrosis: Secondary | ICD-10-CM | POA: Diagnosis not present

## 2018-01-27 LAB — PREGNANCY, URINE: Preg Test, Ur: NEGATIVE

## 2018-01-27 LAB — CBG MONITORING, ED: GLUCOSE-CAPILLARY: 121 mg/dL — AB (ref 70–99)

## 2018-01-27 MED ORDER — DEXAMETHASONE SODIUM PHOSPHATE 10 MG/ML IJ SOLN
10.0000 mg | Freq: Once | INTRAMUSCULAR | Status: AC
  Administered 2018-01-27: 10 mg via INTRAVENOUS
  Filled 2018-01-27: qty 1

## 2018-01-27 MED ORDER — PENICILLIN G BENZATHINE 1200000 UNIT/2ML IM SUSP
1.2000 10*6.[IU] | Freq: Once | INTRAMUSCULAR | Status: AC
  Administered 2018-01-27: 1.2 10*6.[IU] via INTRAMUSCULAR
  Filled 2018-01-27: qty 2

## 2018-01-27 MED ORDER — SODIUM CHLORIDE 0.9 % IV BOLUS
1000.0000 mL | Freq: Once | INTRAVENOUS | Status: AC
  Administered 2018-01-27: 1000 mL via INTRAVENOUS

## 2018-01-27 MED ORDER — KETOROLAC TROMETHAMINE 30 MG/ML IJ SOLN
30.0000 mg | Freq: Once | INTRAMUSCULAR | Status: AC
  Administered 2018-01-27: 30 mg via INTRAVENOUS
  Filled 2018-01-27: qty 1

## 2018-01-27 MED ORDER — HYDROCODONE-ACETAMINOPHEN 5-325 MG PO TABS: 1.0000 | ORAL_TABLET | Freq: Four times a day (QID) | ORAL | 0 refills | Status: DC | PRN

## 2018-01-27 NOTE — ED Provider Notes (Signed)
[facility] DEPT Provider Note   CSN: [id number] Arrival date & time: 01/27/18  1935     History   Chief Complaint Chief Complaint  Patient presents with  . Sore Throat    HPI Gabrielle Edwards is a 27 y.o. female.  HPI Patient presents with sore throat and fever.  Has been feeling weak all over.  States that his sore throat started yesterday.  Fevers up to 102 at home.  While driving today she began to feel lightheaded dizzy and states her vision got blurry.  No chest pain.  Has had some decreased oral intake.  Throat had felt somewhat better today after taking Tylenol but then increased again.  No cough.  No sick contacts.  She does work in the hospital. Past Medical History:  Diagnosis Date  . BV (bacterial vaginosis)   . Gestational diabetes    2015 and currently glyburide  . Hormone imbalance   . Infection    uti  . Normal first pregnancy confirmed, currently in third trimester 07/16/2014  . SVD (spontaneous vaginal delivery) 07/17/2014    Patient Active Problem List   Diagnosis Date Noted  . History of gestational diabetes 03/12/2016    Past Surgical History:  Procedure Laterality Date  . CESAREAN SECTION N/A 01/25/2016   Procedure: CESAREAN SECTION;  Surgeon: Truett Mainland, DO;  Location: [facility];  Service: Obstetrics;  Laterality: N/A;  . NO PAST SURGERIES       OB History    Gravida  2   Para  2   Term  2   Preterm  0   AB  0   Living  2     SAB  0   TAB  0   Ectopic  0   Multiple  0   Live Births  2            Home Medications    Prior to Admission medications   Medication Sig Start Date End Date Taking? Authorizing Provider  acetaminophen (TYLENOL) 500 MG tablet Take 1,000 mg by mouth every 6 (six) hours as needed for mild pain or headache.   Yes [provider]  Blood Glucose Monitoring Suppl (ACCU-CHEK AVIVA PLUS) w/Device KIT 1 Device by Does not apply route as directed. Patient  not taking: Reported on 03/12/2016 11/29/15   Tamala Julian, [place], [place]  glucose blood (ACCU-CHEK AVIVA) test strip Check blood sugars 4 times a day Patient not taking: Reported on 03/12/2016 11/30/15   Tamala Julian, [place], CNM  glyBURIDE (DIABETA) 2.5 MG tablet Take 2 tablets (5 mg total) by mouth daily with breakfast. Patient not taking: Reported on 03/12/2016 01/08/16   Woodroe Mode, MD  HYDROcodone-acetaminophen (NORCO/VICODIN) 5-325 MG tablet Take 1-2 tablets by mouth every 6 (six) hours as needed. 01/27/18   Davonna Belling, MD  ibuprofen (ADVIL,MOTRIN) 800 MG tablet Take 1 tablet (800 mg total) by mouth every 8 (eight) hours as needed. Patient not taking: Reported on 03/12/2016 01/28/16   Marcille Buffy D, CNM  oxyCODONE-acetaminophen (PERCOCET/ROXICET) 5-325 MG tablet Take 2 tablets by mouth every 4 (four) hours as needed for severe pain. Patient not taking: Reported on 03/12/2016 01/28/16   Marcille Buffy D, CNM  Prenatal Multivit-Min-Fe-FA (PRENATAL VITAMINS) 0.8 MG tablet Take 1 tablet by mouth daily. Patient not taking: Reported on 03/12/2016 12/28/15   Aletha Halim, MD    Family History Family History  Problem Relation Age of Onset  . Hypertension Father   . Lupus  Maternal Aunt   . Kidney disease Maternal Grandmother   . Diabetes Maternal Grandmother   . Heart disease Paternal Grandfather     Social History Social History   Tobacco Use  . Smoking status: Never Smoker  . Smokeless tobacco: Never Used  Substance Use Topics  . Alcohol use: No    Alcohol/week: 0.0 oz  . Drug use: No     Allergies   Patient has no known allergies.   Review of Systems Review of Systems  Constitutional: Positive for appetite change, fatigue and fever.  HENT: Positive for sore throat. Negative for trouble swallowing.   Eyes: Positive for visual disturbance.  Respiratory: Negative for shortness of breath.   Cardiovascular: Negative for chest pain.  Genitourinary: Negative for flank pain.    Musculoskeletal: Negative for back pain.  Skin: Negative for rash.  Neurological: Positive for light-headedness.  Hematological: Negative for adenopathy.  Psychiatric/Behavioral: Negative for confusion.     Physical Exam Updated Vital Signs BP 94/78   Pulse 94   Temp 100.2 F (37.9 C) (Oral)   Resp 12   Ht 5' (1.524 m)   Wt 87.1 kg (192 lb)   LMP 11/26/2017   SpO2 99%   BMI 37.50 kg/m   Physical Exam  Constitutional: She is oriented to person, place, and time. She appears toxic.  HENT:  Mouth/Throat: Oropharyngeal exudate present.  Bilateral lower tonsillar swelling.  Mild exudate.  No stridor.  Anterior cervical lymphadenopathy.  Neck: Normal range of motion.  Cardiovascular:  Tachycardia  Pulmonary/Chest: Effort normal.  Abdominal: Soft. There is no tenderness.  Neurological: She is alert and oriented to person, place, and time.  Skin: Skin is warm. Capillary refill takes less than 2 seconds.     ED Treatments / Results  Labs (all labs ordered are listed, but only abnormal results are displayed) Labs Reviewed  CBG MONITORING, ED - Abnormal; Notable for the following components:      Result Value   Glucose-Capillary 121 (*)    All other components within normal limits  PREGNANCY, URINE    EKG None  Radiology No results found.  Procedures Procedures (including critical care time)  Medications Ordered in ED Medications  sodium chloride 0.9 % bolus 1,000 mL (0 mLs Intravenous Stopped 01/27/18 2312)  dexamethasone (DECADRON) injection 10 mg (10 mg Intravenous Given 01/27/18 2030)  ketorolac (TORADOL) 30 MG/ML injection 30 mg (30 mg Intravenous Given 01/27/18 2119)  penicillin g benzathine (BICILLIN LA) 1200000 UNIT/2ML injection 1.2 Million Units (1.2 Million Units Intramuscular Given 01/27/18 2312)     Initial Impression / Assessment and Plan / ED Course  I have reviewed the triage vital signs and the nursing notes.  Pertinent labs & imaging results that  were available during my care of the patient were reviewed by me and considered in my medical decision making (see chart for details).     Patient with pharyngitis. likely strep.  Will treat with penicillin.  Given IV fluids.  No apparent peritonsillar abscess.  Steroids also given.  Discharge home.  Final Clinical Impressions(s) / ED Diagnoses   Final diagnoses:  Pharyngitis, unspecified etiology    ED Discharge Orders        Ordered    HYDROcodone-acetaminophen (NORCO/VICODIN) 5-325 MG tablet  Every 6 hours PRN     01/27/18 2259       Davonna Belling, MD 01/28/18 [phone]

## 2018-01-27 NOTE — ED Triage Notes (Signed)
Pt arrived via [facility] with complaints of a sore throat and fever that started last night. States her fever was 102, took tylenol.

## 2018-03-09 ENCOUNTER — Ambulatory Visit: Payer: Self-pay | Admitting: Family Medicine

## 2018-03-09 VITALS — BP 105/75 | HR 87 | Temp 97.9°F | Resp 16 | Ht 60.0 in | Wt 194.4 lb

## 2018-03-09 DIAGNOSIS — Z0000 Encounter for general adult medical examination without abnormal findings: Secondary | ICD-10-CM

## 2018-03-09 NOTE — Patient Instructions (Addendum)
PLAN< TB status verification needed- Quantiferon ordered- will complete paperwork when results available                          All other immunizations UTD  Health Maintenance, Female Adopting a healthy lifestyle and getting preventive care can go a long way to promote health and wellness. Talk with your health care provider about what schedule of regular examinations is right for you. This is a good chance for you to check in with your provider about disease prevention and staying healthy. In between checkups, there are plenty of things you can do on your own. Experts have done a lot of research about which lifestyle changes and preventive measures are most likely to keep you healthy. Ask your health care provider for more information. Weight and diet Eat a healthy diet  Be sure to include plenty of vegetables, fruits, low-fat dairy products, and lean protein.  Do not eat a lot of foods high in solid fats, added sugars, or salt.  Get regular exercise. This is one of the most important things you can do for your health. ? Most adults should exercise for at least 150 minutes each week. The exercise should increase your heart rate and make you sweat (moderate-intensity exercise). ? Most adults should also do strengthening exercises at least twice a week. This is in addition to the moderate-intensity exercise.  Maintain a healthy weight  Body mass index (BMI) is a measurement that can be used to identify possible weight problems. It estimates body fat based on height and weight. Your health care provider can help determine your BMI and help you achieve or maintain a healthy weight.  For females 64 years of age and older: ? A BMI below 18.5 is considered underweight. ? A BMI of 18.5 to 24.9 is normal. ? A BMI of 25 to 29.9 is considered overweight. ? A BMI of 30 and above is considered obese.  Watch levels of cholesterol and blood lipids  You should start having your blood tested for  lipids and cholesterol at 28 years of age, then have this test every 5 years.  You may need to have your cholesterol levels checked more often if: ? Your lipid or cholesterol levels are high. ? You are older than 28 years of age. ? You are at high risk for heart disease.  Cancer screening Lung Cancer  Lung cancer screening is recommended for adults 31-20 years old who are at high risk for lung cancer because of a history of smoking.  A yearly low-dose CT scan of the lungs is recommended for people who: ? Currently smoke. ? Have quit within the past 15 years. ? Have at least a 30-pack-year history of smoking. A pack year is smoking an average of one pack of cigarettes a day for 1 year.  Yearly screening should continue until it has been 15 years since you quit.  Yearly screening should stop if you develop a health problem that would prevent you from having lung cancer treatment.  Breast Cancer  Practice breast self-awareness. This means understanding how your breasts normally appear and feel.  It also means doing regular breast self-exams. Let your health care provider know about any changes, no matter how small.  If you are in your 20s or 30s, you should have a clinical breast exam (CBE) by a health care provider every 1-3 years as part of a regular health exam.  If you are  84 or older, have a CBE every year. Also consider having a breast X-ray (mammogram) every year.  If you have a family history of breast cancer, talk to your health care provider about genetic screening.  If you are at high risk for breast cancer, talk to your health care provider about having an MRI and a mammogram every year.  Breast cancer gene (BRCA) assessment is recommended for women who have family members with BRCA-related cancers. BRCA-related cancers include: ? Breast. ? Ovarian. ? Tubal. ? Peritoneal cancers.  Results of the assessment will determine the need for genetic counseling and BRCA1 and  BRCA2 testing.  Cervical Cancer Your health care provider may recommend that you be screened regularly for cancer of the pelvic organs (ovaries, uterus, and vagina). This screening involves a pelvic examination, including checking for microscopic changes to the surface of your cervix (Pap test). You may be encouraged to have this screening done every 3 years, beginning at age 13.  For women ages 65-65, health care providers may recommend pelvic exams and Pap testing every 3 years, or they may recommend the Pap and pelvic exam, combined with testing for human papilloma virus (HPV), every 5 years. Some types of HPV increase your risk of cervical cancer. Testing for HPV may also be done on women of any age with unclear Pap test results.  Other health care providers may not recommend any screening for nonpregnant women who are considered low risk for pelvic cancer and who do not have symptoms. Ask your health care provider if a screening pelvic exam is right for you.  If you have had past treatment for cervical cancer or a condition that could lead to cancer, you need Pap tests and screening for cancer for at least 20 years after your treatment. If Pap tests have been discontinued, your risk factors (such as having a new sexual partner) need to be reassessed to determine if screening should resume. Some women have medical problems that increase the chance of getting cervical cancer. In these cases, your health care provider may recommend more frequent screening and Pap tests.  Colorectal Cancer  This type of cancer can be detected and often prevented.  Routine colorectal cancer screening usually begins at 28 years of age and continues through 28 years of age.  Your health care provider may recommend screening at an earlier age if you have risk factors for colon cancer.  Your health care provider may also recommend using home test kits to check for hidden blood in the stool.  A small camera at the  end of a tube can be used to examine your colon directly (sigmoidoscopy or colonoscopy). This is done to check for the earliest forms of colorectal cancer.  Routine screening usually begins at age 92.  Direct examination of the colon should be repeated every 5-10 years through 28 years of age. However, you may need to be screened more often if early forms of precancerous polyps or small growths are found.  Skin Cancer  Check your skin from head to toe regularly.  Tell your health care provider about any new moles or changes in moles, especially if there is a change in a mole's shape or color.  Also tell your health care provider if you have a mole that is larger than the size of a pencil eraser.  Always use sunscreen. Apply sunscreen liberally and repeatedly throughout the day.  Protect yourself by wearing long sleeves, pants, a wide-brimmed hat, and sunglasses whenever you  are outside.  Heart disease, diabetes, and high blood pressure  High blood pressure causes heart disease and increases the risk of stroke. High blood pressure is more likely to develop in: ? People who have blood pressure in the high end of the normal range (130-139/85-89 mm Hg). ? People who are overweight or obese. ? People who are African American.  If you are 4-73 years of age, have your blood pressure checked every 3-5 years. If you are 64 years of age or older, have your blood pressure checked every year. You should have your blood pressure measured twice-once when you are at a hospital or clinic, and once when you are not at a hospital or clinic. Record the average of the two measurements. To check your blood pressure when you are not at a hospital or clinic, you can use: ? An automated blood pressure machine at a pharmacy. ? A home blood pressure monitor.  If you are between 33 years and 74 years old, ask your health care provider if you should take aspirin to prevent strokes.  Have regular diabetes  screenings. This involves taking a blood sample to check your fasting blood sugar level. ? If you are at a normal weight and have a low risk for diabetes, have this test once every three years after 28 years of age. ? If you are overweight and have a high risk for diabetes, consider being tested at a younger age or more often. Preventing infection Hepatitis B  If you have a higher risk for hepatitis B, you should be screened for this virus. You are considered at high risk for hepatitis B if: ? You were born in a country where hepatitis B is common. Ask your health care provider which countries are considered high risk. ? Your parents were born in a high-risk country, and you have not been immunized against hepatitis B (hepatitis B vaccine). ? You have HIV or AIDS. ? You use needles to inject street drugs. ? You live with someone who has hepatitis B. ? You have had sex with someone who has hepatitis B. ? You get hemodialysis treatment. ? You take certain medicines for conditions, including cancer, organ transplantation, and autoimmune conditions.  Hepatitis C  Blood testing is recommended for: ? Everyone born from [birth year] through 1966. ? Anyone with known risk factors for hepatitis C.  Sexually transmitted infections (STIs)  You should be screened for sexually transmitted infections (STIs) including gonorrhea and chlamydia if: ? You are sexually active and are younger than 28 years of age. ? You are older than 28 years of age and your health care provider tells you that you are at risk for this type of infection. ? Your sexual activity has changed since you were last screened and you are at an increased risk for chlamydia or gonorrhea. Ask your health care provider if you are at risk.  If you do not have HIV, but are at risk, it may be recommended that you take a prescription medicine daily to prevent HIV infection. This is called pre-exposure prophylaxis (PrEP). You are considered at risk  if: ? You are sexually active and do not regularly use condoms or know the HIV status of your partner(s). ? You take drugs by injection. ? You are sexually active with a partner who has HIV.  Talk with your health care provider about whether you are at high risk of being infected with HIV. If you choose to begin PrEP, you should first be  tested for HIV. You should then be tested every 3 months for as long as you are taking PrEP. Pregnancy  If you are premenopausal and you may become pregnant, ask your health care provider about preconception counseling.  If you may become pregnant, take 400 to 800 micrograms (mcg) of folic acid every day.  If you want to prevent pregnancy, talk to your health care provider about birth control (contraception). Osteoporosis and menopause  Osteoporosis is a disease in which the bones lose minerals and strength with aging. This can result in serious bone fractures. Your risk for osteoporosis can be identified using a bone density scan.  If you are 13 years of age or older, or if you are at risk for osteoporosis and fractures, ask your health care provider if you should be screened.  Ask your health care provider whether you should take a calcium or vitamin D supplement to lower your risk for osteoporosis.  Menopause may have certain physical symptoms and risks.  Hormone replacement therapy may reduce some of these symptoms and risks. Talk to your health care provider about whether hormone replacement therapy is right for you. Follow these instructions at home:  Schedule regular health, dental, and eye exams.  Stay current with your immunizations.  Do not use any tobacco products including cigarettes, chewing tobacco, or electronic cigarettes.  If you are pregnant, do not drink alcohol.  If you are breastfeeding, limit how much and how often you drink alcohol.  Limit alcohol intake to no more than 1 drink per day for nonpregnant women. One drink  equals 12 ounces of beer, 5 ounces of wine, or 1 ounces of hard liquor.  Do not use street drugs.  Do not share needles.  Ask your health care provider for help if you need support or information about quitting drugs.  Tell your health care provider if you often feel depressed.  Tell your health care provider if you have ever been abused or do not feel safe at home. This information is not intended to replace advice given to you by your health care provider. Make sure you discuss any questions you have with your health care provider. Document Released: 01/28/2011 Document Revised: 12/21/2015 Document Reviewed: 04/18/2015 Elsevier Interactive Patient Education  [employer].

## 2018-03-09 NOTE — Progress Notes (Signed)
Gabrielle Edwards is a 27 y.o. female who presents today with concerns of need for a physical for school. She has presented shot records and titer information. She does need a 2-step TB test or quantiferon. She is otherwise healthy without complaint.  Review of Systems  Constitutional: Negative for chills, fever and malaise/fatigue.  HENT: Negative for congestion, ear discharge, ear pain, sinus pain and sore throat.   Eyes: Negative.   Respiratory: Negative for cough, sputum production and shortness of breath.   Cardiovascular: Negative.  Negative for chest pain.  Gastrointestinal: Negative for abdominal pain, diarrhea, nausea and vomiting.  Genitourinary: Negative for dysuria, frequency, hematuria and urgency.  Musculoskeletal: Negative for myalgias.  Skin: Negative.   Neurological: Negative for headaches.  Endo/Heme/Allergies: Negative.   Psychiatric/Behavioral: Negative.     O: Vitals:   03/09/18 1343  BP: 105/75  Pulse: 87  Resp: 16  Temp: 97.9 F (36.6 C)  SpO2: 98%     Physical Exam  Constitutional: She is oriented to person, place, and time. Vital signs are normal. She appears well-developed and well-nourished. She is active.  Non-toxic appearance. She does not have a sickly appearance.  HENT:  Head: Normocephalic.  Right Ear: Hearing, tympanic membrane, external ear and ear canal normal.  Left Ear: Hearing, tympanic membrane, external ear and ear canal normal.  Nose: Nose normal.  Mouth/Throat: Uvula is midline and oropharynx is clear and moist.  Neck: Normal range of motion. Neck supple.  Cardiovascular: Normal rate, regular rhythm, normal heart sounds and normal pulses.  Pulmonary/Chest: Effort normal and breath sounds normal.  Abdominal: Soft. Bowel sounds are normal.  Musculoskeletal: Normal range of motion.  Lymphadenopathy:       Head (right side): No submental and no submandibular adenopathy present.       Head (left side): No submental and no submandibular  adenopathy present.    She has no cervical adenopathy.  Neurological: She is alert and oriented to person, place, and time.  Psychiatric: She has a normal mood and affect.  Vitals reviewed.  A: 1. Physical exam    P: Discussed exam findings, diagnosis etiology and medication use and indications reviewed with patient. Follow- Up and discharge instructions provided. No emergent/urgent issues found on exam.  Patient verbalized understanding of information provided and agrees with plan of care (POC), all questions answered.  1. Physical exam TB status verification needed- Quantiferon ordered- will complete paperwork when results available All other immunizations UTD Form filled out and returned - QuantiFERON-TB Gold Plus

## 2019-11-18 ENCOUNTER — Ambulatory Visit (INDEPENDENT_AMBULATORY_CARE_PROVIDER_SITE_OTHER): Payer: No Typology Code available for payment source | Admitting: Adult Health Nurse Practitioner

## 2019-11-18 ENCOUNTER — Other Ambulatory Visit: Payer: Self-pay

## 2019-11-18 ENCOUNTER — Encounter: Payer: Self-pay | Admitting: Adult Health Nurse Practitioner

## 2019-11-18 VITALS — BP 123/80 | HR 76 | Temp 97.5°F | Resp 17 | Ht 60.0 in | Wt 191.8 lb

## 2019-11-18 DIAGNOSIS — Z23 Encounter for immunization: Secondary | ICD-10-CM | POA: Diagnosis not present

## 2019-11-18 DIAGNOSIS — E119 Type 2 diabetes mellitus without complications: Secondary | ICD-10-CM | POA: Diagnosis not present

## 2019-11-18 DIAGNOSIS — Z8632 Personal history of gestational diabetes: Secondary | ICD-10-CM | POA: Diagnosis not present

## 2019-11-18 LAB — CMP14+EGFR

## 2019-11-18 LAB — GLUCOSE, POCT (MANUAL RESULT ENTRY): POC Glucose: 142 mg/dl — AB (ref 70–99)

## 2019-11-18 MED ORDER — METFORMIN HCL ER 750 MG PO TB24: 750.0000 mg | ORAL_TABLET | Freq: Every day | ORAL | 1 refills | Status: DC

## 2019-11-18 MED ORDER — ENALAPRIL MALEATE 2.5 MG PO TABS: 2.5000 mg | ORAL_TABLET | Freq: Every day | ORAL | 3 refills | Status: DC

## 2019-11-18 MED ORDER — RELION PREMIER TEST VI STRP: ORAL_STRIP | 12 refills | Status: AC

## 2019-11-18 NOTE — Progress Notes (Signed)
Chief Complaint  Patient presents with  . New Patient (Initial Visit)    establish care.  [facility]/19-macrobid given, sugar and urine, bs 330 and dx with diabetes.  Thru diet and exercise pt is trying to manage and doesn't want to be on insulin.  Blood sugar this morning before eating 120.  Pt has running record of bs's since 11/04/19 diagnosis    HPI   Patient presents with Type II DM starting with gestational diabetes in 2017.  3 weeks ago had UTI, April 8.  [facility] showed sugar in urine.  Finger stick 330.   Eating less than 1600 calories a day currently since April 8.  From 330bs dpown to 120 this a.m.  Reviewed her log of BS which she is checking at least 3x a day with noted improvement from high 170s down to 120-139 over the past week.  Patient started exercising:  50mn to 1 hour daily walking or running.    We discussed med mgmt.  She is amenable to using Metformin which I recommended in conjunction with the already solid work she is doing to lower her sugars.  Should BS start running even lower, could decrease or even d/c in future.   Added renal protective agent:  Enalapril 2.545mAdvised ASA 81 mg qhs.    Problem List    Problem List: 2017-08: History of gestational diabetes 2017-06: Gestational diabetes 2017-06: Fetal macrosomia 2017-04: Supervision of high risk pregnancy, antepartum 2017-04: Gestational diabetes mellitus (GDM), antepartum 2015-12: SVD (spontaneous vaginal delivery) 2015-12: Normal first pregnancy confirmed, currently in third  trimester 2015-12: Personal history of previous postdates pregnancy   Allergies   has No Known Allergies.  Medications    Current Outpatient Medications:  .  acetaminophen (TYLENOL) 500 MG tablet, Take 1,000 mg by mouth every 6 (six) hours as needed for mild pain or headache., Disp: , Rfl:  .  Blood Glucose Monitoring Suppl (ACCU-CHEK AVIVA PLUS) w/Device KIT, 1 Device by Does not apply route as  directed., Disp: 1 kit, Rfl: 0 .  glucose blood (ACCU-CHEK AVIVA) test strip, Check blood sugars 4 times a day, Disp: 100 each, Rfl: 12 .  glyBURIDE (DIABETA) 2.5 MG tablet, Take 2 tablets (5 mg total) by mouth daily with breakfast. (Patient not taking: Reported on 03/12/2016), Disp: 60 tablet, Rfl: 3 .  HYDROcodone-acetaminophen (NORCO/VICODIN) 5-325 MG tablet, Take 1-2 tablets by mouth every 6 (six) hours as needed. (Patient not taking: Reported on 03/09/2018), Disp: 8 tablet, Rfl: 0 .  ibuprofen (ADVIL,MOTRIN) 800 MG tablet, Take 1 tablet (800 mg total) by mouth every 8 (eight) hours as needed. (Patient not taking: Reported on 03/12/2016), Disp: 30 tablet, Rfl: 0 .  oxyCODONE-acetaminophen (PERCOCET/ROXICET) 5-325 MG tablet, Take 2 tablets by mouth every 4 (four) hours as needed for severe pain. (Patient not taking: Reported on 03/12/2016), Disp: 15 tablet, Rfl: 0 .  Prenatal Multivit-Min-Fe-FA (PRENATAL VITAMINS) 0.8 MG tablet, Take 1 tablet by mouth daily. (Patient not taking: Reported on 03/12/2016), Disp: 30 tablet, Rfl: 12   Review of Systems    Constitutional: Negative for activity change, appetite change, chills and fever.  HENT: Negative for congestion, nosebleeds, trouble swallowing and voice change.   Respiratory: Negative for cough, shortness of breath and wheezing.   Cardiac:  Negative for chest pain, pressure, syncope  Gastrointestinal: Negative for diarrhea, nausea and vomiting.  Genitourinary: Negative for difficulty urinating, dysuria, flank pain and hematuria.  Musculoskeletal: Negative for back pain, joint  swelling and neck pain.  Neurological: Negative for dizziness, speech difficulty, light-headedness and numbness.  See HPI. All other review of systems negative.     Physical Exam:    height is 5' (1.524 m) and weight is 191 lb 12.8 oz (87 kg). Her temporal temperature is 97.5 F (36.4 C) (abnormal). Her blood pressure is 123/80 and her pulse is 76. Her respiration is 17 and  oxygen saturation is 98%.   Physical Examination: General appearance - alert, well appearing, and in no distress and oriented to person, place, and time Mental status - normal mood, behavior, speech, dress, motor activity, and thought processes Eyes - PERRL. Extraocular movements intact.  No nystagmus.  Neck - supple, no significant adenopathy, carotids upstroke normal bilaterally, no bruits, thyroid exam: thyroid is normal in size without nodules or tenderness Chest - clear to auscultation, no wheezes, rales or rhonchi, symmetric air entry  Heart - normal rate, regular rhythm, normal S1, S2, no murmurs, rubs, clicks or gallops Extremities - dependent LE edema without clubbing or cyanosis Skin - normal coloration and turgor, no rashes, no suspicious skin lesions noted  No hyperpigmentation of skin.  No current hematomas noted   Lab /Imaging Review   Reviewed all blood sugars recently with patient   Assessment & Plan:  Gabrielle Edwards is a 29 y.o. female    1. Type 2 diabetes mellitus without complication, without long-term current use of insulin ([place])   2. Need for prophylactic vaccination against Streptococcus pneumoniae (pneumococcus)   3. History of gestational diabetes    Meds ordered this encounter  Medications  . enalapril (VASOTEC) 2.5 MG tablet    Sig: Take 1 tablet (2.5 mg total) by mouth daily.    Dispense:  30 tablet    Refill:  3  . metFORMIN (GLUCOPHAGE XR) 750 MG 24 hr tablet    Sig: Take 1 tablet (750 mg total) by mouth daily with breakfast.    Dispense:  30 tablet    Refill:  1  . glucose blood (RELION PREMIER TEST) test strip    Sig: Use as instructed    Dispense:  100 each    Refill:  12   Orders Placed This Encounter  Procedures  . CMP14+EGFR  . TSH  . Hemoglobin A1c  . POCT glucose (manual entry)   Will f/u in 1 week for review and fasting lipids.  She isi nline with this plan. IN addition, will need a PAP.   Glyn Ade, NP

## 2019-11-18 NOTE — Patient Instructions (Signed)
° ° ° °  If you have lab work done today you will be contacted with your lab results within the next 2 weeks.  If you have not heard from us then please contact us. The fastest way to get your results is to register for My Chart. ° ° °IF you received an x-ray today, you will receive an invoice from [facility]. Please contact [facility] at [phone] with questions or concerns regarding your invoice.  ° °IF you received labwork today, you will receive an invoice from [facility]. Please contact [facility] at [phone] with questions or concerns regarding your invoice.  ° °Our billing staff will not be able to assist you with questions regarding bills from these companies. ° °You will be contacted with the lab results as soon as they are available. The fastest way to get your results is to activate your My Chart account. Instructions are located on the last page of this paperwork. If you have not heard from us regarding the results in 2 weeks, please contact this office. °  ° ° ° °

## 2019-11-19 LAB — HEMOGLOBIN A1C
Est. average glucose Bld gHb Est-mCnc: 200 mg/dL
Hgb A1c MFr Bld: 8.6 % — ABNORMAL HIGH (ref 4.8–5.6)

## 2019-11-19 LAB — CMP14+EGFR
ALT: 192 IU/L — ABNORMAL HIGH (ref 0–32)
AST: 103 IU/L — ABNORMAL HIGH (ref 0–40)
Albumin/Globulin Ratio: 1.6 (ref 1.2–2.2)
Albumin: 5.1 g/dL — ABNORMAL HIGH (ref 3.9–5.0)
Alkaline Phosphatase: 106 IU/L (ref 39–117)
BUN/Creatinine Ratio: 23 (ref 9–23)
BUN: 13 mg/dL (ref 6–20)
Bilirubin Total: 0.4 mg/dL (ref 0.0–1.2)
CO2: 22 mmol/L (ref 20–29)
Calcium: 10.4 mg/dL — ABNORMAL HIGH (ref 8.7–10.2)
Creatinine, Ser: 0.57 mg/dL (ref 0.57–1.00)
GFR calc Af Amer: 145 mL/min/{1.73_m2} (ref >59)
GFR calc non Af Amer: 126 mL/min/{1.73_m2} (ref >59)
Globulin, Total: 3.2 g/dL (ref 1.5–4.5)
Glucose: 139 mg/dL — ABNORMAL HIGH (ref 65–99)
Potassium: 3.7 mmol/L (ref 3.5–5.2)
Sodium: 140 mmol/L (ref 134–144)
Total Protein: 8.3 g/dL (ref 6.0–8.5)

## 2019-11-19 LAB — TSH: TSH: 2.87 u[IU]/mL (ref 0.450–4.500)

## 2019-11-25 ENCOUNTER — Encounter: Payer: Self-pay | Admitting: Adult Health Nurse Practitioner

## 2019-11-25 ENCOUNTER — Ambulatory Visit (INDEPENDENT_AMBULATORY_CARE_PROVIDER_SITE_OTHER): Payer: No Typology Code available for payment source | Admitting: Adult Health Nurse Practitioner

## 2019-11-25 ENCOUNTER — Other Ambulatory Visit: Payer: Self-pay

## 2019-11-25 VITALS — BP 122/85 | HR 79 | Temp 97.7°F | Resp 17 | Ht 60.0 in | Wt 191.2 lb

## 2019-11-25 DIAGNOSIS — R748 Abnormal levels of other serum enzymes: Secondary | ICD-10-CM | POA: Diagnosis not present

## 2019-11-25 DIAGNOSIS — E119 Type 2 diabetes mellitus without complications: Secondary | ICD-10-CM | POA: Diagnosis not present

## 2019-11-25 LAB — POCT URINALYSIS DIP (MANUAL ENTRY)
Bilirubin, UA: NEGATIVE
Blood, UA: NEGATIVE
Glucose, UA: NEGATIVE mg/dL
Leukocytes, UA: NEGATIVE
Nitrite, UA: POSITIVE — AB
Protein Ur, POC: NEGATIVE mg/dL
Spec Grav, UA: 1.025 (ref 1.010–1.025)
Urobilinogen, UA: 0.2 E.U./dL
pH, UA: 5.5 (ref 5.0–8.0)

## 2019-11-25 NOTE — Progress Notes (Addendum)
SUBJECTIVE: 29 y.o. female for follow up of diabetes. Diabetic Review of Systems - medication compliance: compliant all of the time, diabetic diet compliance: compliant all of the time, home glucose monitoring: is performed regularly, acute symptoms are related to new medications.  Tried Metformin and Enalapril separately with both causing significant side effects.   .  Other symptoms and concerns: elevated liver function in last labs  Current Outpatient Medications  Medication Sig Dispense Refill  . acetaminophen (TYLENOL) 500 MG tablet Take 1,000 mg by mouth every 6 (six) hours as needed for mild pain or headache.    . Blood Glucose Monitoring Suppl (ACCU-CHEK AVIVA PLUS) w/Device KIT 1 Device by Does not apply route as directed. 1 kit 0  . enalapril (VASOTEC) 2.5 MG tablet Take 1 tablet (2.5 mg total) by mouth daily. 30 tablet 3  . glucose blood (RELION PREMIER TEST) test strip Use as instructed 100 each 12  . ibuprofen (ADVIL,MOTRIN) 800 MG tablet Take 1 tablet (800 mg total) by mouth every 8 (eight) hours as needed. 30 tablet 0  . metFORMIN (GLUCOPHAGE XR) 750 MG 24 hr tablet Take 1 tablet (750 mg total) by mouth daily with breakfast. (Patient not taking: Reported on 11/25/2019) 30 tablet 1  . oxyCODONE-acetaminophen (PERCOCET/ROXICET) 5-325 MG tablet Take 2 tablets by mouth every 4 (four) hours as needed for severe pain. (Patient not taking: Reported on 03/12/2016) 15 tablet 0  . Prenatal Multivit-Min-Fe-FA (PRENATAL VITAMINS) 0.8 MG tablet Take 1 tablet by mouth daily. (Patient not taking: Reported on 03/12/2016) 30 tablet 12   No current facility-administered medications for this visit.    OBJECTIVE: Appearance: alert, well appearing, and in no distress. BP 122/85 (BP Location: Right Arm, Patient Position: Sitting, Cuff Size: Normal)   Pulse 79   Temp 97.7 F (36.5 C) (Temporal)   Resp 17   Ht 5' (1.524 m)   Wt 191 lb 3.2 oz (86.7 kg)   LMP 11/16/2019   SpO2 98%   BMI 37.34 kg/m    Exam: heart sounds normal rate, regular rhythm, normal S1, S2, no murmurs, rubs, clicks or gallops, chest clear  ASSESSMENT: Diabetes Mellitus: reasonably well controlled  PLAN:  Will D/C Metformin and Enalapril at this time.  Patient is doing extremey well with improving BS and monitoring multiple times a day.  She may be able to reach non-diabetic range as blood sugars are hitting the 120s fasting in the a.m.   See orders for this visit as documented in the electronic medical record. Issues reviewed with her: Will place orders for repeat hepatic function/CMP in 6 weeks +Hep C Ab.  If abnormal, would refer accordingly.  She is inline with this plan.  Glyn Ade, NP

## 2019-11-25 NOTE — Patient Instructions (Signed)
° ° ° °  If you have lab work done today you will be contacted with your lab results within the next 2 weeks.  If you have not heard from us then please contact us. The fastest way to get your results is to register for My Chart. ° ° °IF you received an x-ray today, you will receive an invoice from [facility]. Please contact [facility] at [phone] with questions or concerns regarding your invoice.  ° °IF you received labwork today, you will receive an invoice from [facility]. Please contact [facility] at [phone] with questions or concerns regarding your invoice.  ° °Our billing staff will not be able to assist you with questions regarding bills from these companies. ° °You will be contacted with the lab results as soon as they are available. The fastest way to get your results is to activate your My Chart account. Instructions are located on the last page of this paperwork. If you have not heard from us regarding the results in 2 weeks, please contact this office. °  ° ° ° °

## 2019-11-25 NOTE — Addendum Note (Signed)
Addended by: Janne Lab on: 11/25/2019 10:40 AM   Modules accepted: Orders

## 2020-01-06 ENCOUNTER — Ambulatory Visit: Payer: No Typology Code available for payment source | Admitting: Adult Health Nurse Practitioner

## 2020-02-24 ENCOUNTER — Encounter: Payer: No Typology Code available for payment source | Admitting: Adult Health Nurse Practitioner

## 2020-03-03 ENCOUNTER — Encounter: Payer: No Typology Code available for payment source | Admitting: Family Medicine

## 2020-04-28 ENCOUNTER — Encounter: Payer: No Typology Code available for payment source | Admitting: Registered Nurse

## 2020-06-21 ENCOUNTER — Other Ambulatory Visit: Payer: Self-pay

## 2020-06-21 ENCOUNTER — Ambulatory Visit: Payer: No Typology Code available for payment source | Admitting: Registered Nurse

## 2020-06-21 ENCOUNTER — Encounter: Payer: Self-pay | Admitting: Registered Nurse

## 2020-06-21 VITALS — BP 132/84 | HR 90 | Temp 97.9°F | Resp 15 | Ht 60.0 in | Wt 198.0 lb

## 2020-06-21 DIAGNOSIS — B373 Candidiasis of vulva and vagina: Secondary | ICD-10-CM | POA: Diagnosis not present

## 2020-06-21 DIAGNOSIS — N898 Other specified noninflammatory disorders of vagina: Secondary | ICD-10-CM | POA: Diagnosis not present

## 2020-06-21 DIAGNOSIS — E119 Type 2 diabetes mellitus without complications: Secondary | ICD-10-CM

## 2020-06-21 DIAGNOSIS — B9689 Other specified bacterial agents as the cause of diseases classified elsewhere: Secondary | ICD-10-CM

## 2020-06-21 DIAGNOSIS — N760 Acute vaginitis: Secondary | ICD-10-CM

## 2020-06-21 DIAGNOSIS — B3731 Acute candidiasis of vulva and vagina: Secondary | ICD-10-CM

## 2020-06-21 DIAGNOSIS — Z7689 Persons encountering health services in other specified circumstances: Secondary | ICD-10-CM

## 2020-06-21 DIAGNOSIS — N3000 Acute cystitis without hematuria: Secondary | ICD-10-CM | POA: Diagnosis not present

## 2020-06-21 LAB — POCT WET + KOH PREP
Trich by wet prep: ABSENT
Yeast by wet prep: ABSENT

## 2020-06-21 LAB — CBC WITH DIFFERENTIAL/PLATELET
Basophils Absolute: 0.1 10*3/uL (ref 0.0–0.2)
Basos: 1 %
EOS (ABSOLUTE): 0.3 10*3/uL (ref 0.0–0.4)
Eos: 3 %
Hematocrit: 41.3 % (ref 34.0–46.6)
Hemoglobin: 13 g/dL (ref 11.1–15.9)
Immature Grans (Abs): 0 10*3/uL (ref 0.0–0.1)
Immature Granulocytes: 0 %
Lymphocytes Absolute: 3.4 10*3/uL — ABNORMAL HIGH (ref 0.7–3.1)
Lymphs: 30 %
MCH: 24.6 pg — ABNORMAL LOW (ref 26.6–33.0)
MCHC: 31.5 g/dL (ref 31.5–35.7)
MCV: 78 fL — ABNORMAL LOW (ref 79–97)
Monocytes Absolute: 0.5 10*3/uL (ref 0.1–0.9)
Monocytes: 4 %
Neutrophils Absolute: 7.1 10*3/uL — ABNORMAL HIGH (ref 1.4–7.0)
Neutrophils: 62 %
Platelets: 288 10*3/uL (ref 150–450)
RBC: 5.28 x10E6/uL (ref 3.77–5.28)
RDW: 13.3 % (ref 11.7–15.4)
WBC: 11.4 10*3/uL — ABNORMAL HIGH (ref 3.4–10.8)

## 2020-06-21 LAB — POCT URINALYSIS DIP (MANUAL ENTRY)
Bilirubin, UA: NEGATIVE
Glucose, UA: >=1000 mg/dL — AB
Ketones, POC UA: NEGATIVE mg/dL
Leukocytes, UA: NEGATIVE
Nitrite, UA: POSITIVE — AB
Protein Ur, POC: NEGATIVE mg/dL
Spec Grav, UA: 1.01 (ref 1.010–1.025)
Urobilinogen, UA: 0.2 E.U./dL
pH, UA: 5 (ref 5.0–8.0)

## 2020-06-21 LAB — COMPREHENSIVE METABOLIC PANEL
ALT: 169 IU/L — ABNORMAL HIGH (ref 0–32)
AST: 122 IU/L — ABNORMAL HIGH (ref 0–40)
Albumin/Globulin Ratio: 1.4 (ref 1.2–2.2)
Albumin: 4.6 g/dL (ref 3.9–5.0)
Alkaline Phosphatase: 140 IU/L — ABNORMAL HIGH (ref 44–121)
BUN/Creatinine Ratio: 14 (ref 9–23)
BUN: 8 mg/dL (ref 6–20)
Bilirubin Total: 0.2 mg/dL (ref 0.0–1.2)
CO2: 22 mmol/L (ref 20–29)
Calcium: 9.9 mg/dL (ref 8.7–10.2)
Chloride: 93 mmol/L — ABNORMAL LOW (ref 96–106)
Creatinine, Ser: 0.58 mg/dL (ref 0.57–1.00)
GFR calc Af Amer: 144 mL/min/{1.73_m2} (ref >59)
GFR calc non Af Amer: 125 mL/min/{1.73_m2} (ref >59)
Globulin, Total: 3.2 g/dL (ref 1.5–4.5)
Glucose: 380 mg/dL — ABNORMAL HIGH (ref 65–99)
Potassium: 4.1 mmol/L (ref 3.5–5.2)
Sodium: 133 mmol/L — ABNORMAL LOW (ref 134–144)
Total Protein: 7.8 g/dL (ref 6.0–8.5)

## 2020-06-21 LAB — POCT GLYCOSYLATED HEMOGLOBIN (HGB A1C): Hemoglobin A1C: 10.3 % — AB (ref 4.0–5.6)

## 2020-06-21 LAB — LIPID PANEL
Chol/HDL Ratio: 5 ratio — ABNORMAL HIGH (ref 0.0–4.4)
Cholesterol, Total: 244 mg/dL — ABNORMAL HIGH (ref 100–199)
HDL: 49 mg/dL (ref >39)
LDL Chol Calc (NIH): 142 mg/dL — ABNORMAL HIGH (ref 0–99)
Triglycerides: 290 mg/dL — ABNORMAL HIGH (ref 0–149)
VLDL Cholesterol Cal: 53 mg/dL — ABNORMAL HIGH (ref 5–40)

## 2020-06-21 LAB — GLUCOSE, POCT (MANUAL RESULT ENTRY): POC Glucose: 378 mg/dl — AB (ref 70–99)

## 2020-06-21 MED ORDER — METRONIDAZOLE 500 MG PO TABS: 500.0000 mg | ORAL_TABLET | Freq: Two times a day (BID) | ORAL | 0 refills | Status: DC

## 2020-06-21 MED ORDER — FLUCONAZOLE 150 MG PO TABS: 150.0000 mg | ORAL_TABLET | Freq: Once | ORAL | 0 refills | Status: AC

## 2020-06-21 MED ORDER — NITROFURANTOIN MONOHYD MACRO 100 MG PO CAPS: 100.0000 mg | ORAL_CAPSULE | Freq: Two times a day (BID) | ORAL | 0 refills | Status: AC

## 2020-06-21 MED ORDER — DAPAGLIFLOZIN PROPANEDIOL 10 MG PO TABS: 10.0000 mg | ORAL_TABLET | Freq: Every day | ORAL | 0 refills | Status: DC

## 2020-06-21 NOTE — Progress Notes (Signed)
Established Patient Office Visit  Subjective:  Patient ID: Gabrielle Edwards, female    DOB: Aug 10, 1990  Age: 29 y.o. MRN: [id number]  CC:  Chief Complaint  Patient presents with  . Establish Care    pt had elevated AST, ALT, and A1c last time had labs Judson Roch had advised follow up but was not here to complete this. also concerned has a yeast infection at this time starting 3 days ago has a discharge and ordor     HPI Gabrielle Edwards presents for TOC from [place] to myself as PCP  Hx reviewed and updated as warranted Past labs reviewed with patient  T2dm: not currently taking medication.  Lab Results  Component Value Date   HGBA1C 8.6 (H) 11/18/2019  No evidence of complications or side effects. Feeling well.  Hypertension: Patient Currently taking: enalapril 2.46m PO every other day. Good effect. No AEs. Denies CV symptoms including: chest pain, shob, doe, headache, visual changes, fatigue, claudication, and dependent edema.   Previous readings and labs: BP Readings from Last 3 Encounters:  06/21/20 132/84  11/25/19 122/85  11/18/19 123/80   Vaginal infection: a few days of discharge and itching. Has had vaginal infections in the past. No fevers, chills, fatigue, sweats, suprapubic pain, or flank pain.  Otherwise no concerns   Lab Results  Component Value Date   CREATININE 0.57 11/18/2019      Past Medical History:  Diagnosis Date  . BV (bacterial vaginosis)   . Gestational diabetes    2015 and currently glyburide  . Hormone imbalance   . Infection    uti  . Normal first pregnancy confirmed, currently in third trimester 07/16/2014  . SVD (spontaneous vaginal delivery) 07/17/2014    Past Surgical History:  Procedure Laterality Date  . CESAREAN SECTION N/A 01/25/2016   Procedure: CESAREAN SECTION;  Surgeon: JTruett Mainland DO;  [facility]  Service: Obstetrics;  Laterality: N/A;  . TUBAL LIGATION      Family History  Problem Relation Age of  Onset  . Hypertension Father   . Lupus Maternal Aunt   . Kidney disease Maternal Grandmother   . Diabetes Maternal Grandmother   . Heart disease Paternal Grandfather   . Diabetes Maternal Grandfather   . Hypertension Maternal Grandfather   . Diabetes Paternal Grandmother   . Hypertension Paternal Grandmother     Social History   Socioeconomic History  . Marital status: Single    Spouse name: Not on file  . Number of children: Not on file  . Years of education: Not on file  . Highest education level: Not on file  Occupational History  . Not on file  Tobacco Use  . Smoking status: Never Smoker  . Smokeless tobacco: Never Used  Vaping Use  . Vaping Use: Never used  Substance and Sexual Activity  . Alcohol use: No    Alcohol/week: 0.0 standard drinks  . Drug use: No  . Sexual activity: Yes    Birth control/protection: None, Surgical  Other Topics Concern  . Not on file  Social History Narrative  . Not on file   Social Determinants of Health   Financial Resource Strain:   . Difficulty of Paying Living Expenses: Not on file  Food Insecurity:   . Worried About RCharity fundraiserin the Last Year: Not on file  . Ran Out of Food in the Last Year: Not on file  Transportation Needs:   . Lack of Transportation (Medical):  Not on file  . Lack of Transportation (Non-Medical): Not on file  Physical Activity:   . Days of Exercise per Week: Not on file  . Minutes of Exercise per Session: Not on file  Stress:   . Feeling of Stress : Not on file  Social Connections:   . Frequency of Communication with Friends and Family: Not on file  . Frequency of Social Gatherings with Friends and Family: Not on file  . Attends Religious Services: Not on file  . Active Member of Clubs or Organizations: Not on file  . Attends Archivist Meetings: Not on file  . Marital Status: Not on file  Intimate Partner Violence:   . Fear of Current or Ex-Partner: Not on file  . Emotionally  Abused: Not on file  . Physically Abused: Not on file  . Sexually Abused: Not on file    Outpatient Medications Prior to Visit  Medication Sig Dispense Refill  . acetaminophen (TYLENOL) 500 MG tablet Take 1,000 mg by mouth every 6 (six) hours as needed for mild pain or headache.    . Blood Glucose Monitoring Suppl (ACCU-CHEK AVIVA PLUS) w/Device KIT 1 Device by Does not apply route as directed. 1 kit 0  . enalapril (VASOTEC) 2.5 MG tablet Take 1 tablet (2.5 mg total) by mouth daily. (Patient taking differently: Take 2.5 mg by mouth every other day. ) 30 tablet 3  . glucose blood (RELION PREMIER TEST) test strip Use as instructed 100 each 12  . ibuprofen (ADVIL,MOTRIN) 800 MG tablet Take 1 tablet (800 mg total) by mouth every 8 (eight) hours as needed. 30 tablet 0   No facility-administered medications prior to visit.    No Known Allergies  ROS Review of Systems  Constitutional: Negative.   HENT: Negative.   Eyes: Negative.   Respiratory: Negative.   Cardiovascular: Negative.   Gastrointestinal: Negative.   Genitourinary: Negative.   Musculoskeletal: Negative.   Skin: Negative.   Neurological: Negative.   Psychiatric/Behavioral: Negative.       Objective:    Physical Exam Vitals and nursing note reviewed.  Constitutional:      General: She is not in acute distress.    Appearance: Normal appearance. She is normal weight. She is not ill-appearing, toxic-appearing or diaphoretic.  Cardiovascular:     Rate and Rhythm: Normal rate and regular rhythm.     Heart sounds: Normal heart sounds. No murmur heard.  No friction rub. No gallop.   Pulmonary:     Effort: Pulmonary effort is normal. No respiratory distress.     Breath sounds: Normal breath sounds. No stridor. No wheezing, rhonchi or rales.  Chest:     Chest wall: No tenderness.  Skin:    General: Skin is warm and dry.  Neurological:     General: No focal deficit present.     Mental Status: She is alert and oriented  to person, place, and time. Mental status is at baseline.  Psychiatric:        Mood and Affect: Mood normal.        Behavior: Behavior normal.        Thought Content: Thought content normal.        Judgment: Judgment normal.     BP 132/84   Pulse 90   Temp 97.9 F (36.6 C) (Temporal)   Resp 15   Ht 5' (1.524 m)   Wt 198 lb (89.8 kg)   SpO2 98%   BMI 38.67 kg/m  Wt  Readings from Last 3 Encounters:  06/21/20 198 lb (89.8 kg)  11/25/19 191 lb 3.2 oz (86.7 kg)  11/18/19 191 lb 12.8 oz (87 kg)     Health Maintenance Due  Topic Date Due  . HEMOGLOBIN A1C  05/19/2020    There are no preventive care reminders to display for this patient.  Lab Results  Component Value Date   TSH 2.870 11/18/2019   Lab Results  Component Value Date   WBC 15.4 (H) 01/27/2016   HGB 9.8 (L) 01/27/2016   HCT 30.0 (L) 01/27/2016   MCV 83.3 01/27/2016   PLT 156 01/27/2016   Lab Results  Component Value Date   NA 140 11/18/2019   K 3.7 11/18/2019   CO2 22 11/18/2019   GLUCOSE 139 (H) 11/18/2019   BUN 13 11/18/2019   CREATININE 0.57 11/18/2019   BILITOT 0.4 11/18/2019   ALKPHOS 106 11/18/2019   AST 103 (H) 11/18/2019   ALT 192 (H) 11/18/2019   PROT 8.3 11/18/2019   ALBUMIN 5.1 (H) 11/18/2019   CALCIUM 10.4 (H) 11/18/2019   No results found for: CHOL No results found for: HDL No results found for: LDLCALC No results found for: TRIG No results found for: CHOLHDL Lab Results  Component Value Date   HGBA1C 8.6 (H) 11/18/2019      Assessment & Plan:   Problem List Items Addressed This Visit    None    Visit Diagnoses    Vaginal discharge    -  Primary   Relevant Orders   POCT Wet + KOH Prep (Completed)   POCT urinalysis dipstick (Completed)   Type 2 diabetes mellitus without complication, without long-term current use of insulin (HCC)       Relevant Medications   dapagliflozin propanediol (FARXIGA) 10 MG TABS tablet   Other Relevant Orders   Comprehensive metabolic panel    CBC with Differential/Platelet   POCT glucose (manual entry) (Completed)   POCT glycosylated hemoglobin (Hb A1C) (Completed)   Lipid panel   Vaginal candidiasis       Relevant Medications   nitrofurantoin, macrocrystal-monohydrate, (MACROBID) 100 MG capsule   metroNIDAZOLE (FLAGYL) 500 MG tablet   fluconazole (DIFLUCAN) 150 MG tablet   Bacterial vaginosis       Relevant Medications   nitrofurantoin, macrocrystal-monohydrate, (MACROBID) 100 MG capsule   metroNIDAZOLE (FLAGYL) 500 MG tablet   fluconazole (DIFLUCAN) 150 MG tablet   Acute cystitis without hematuria       Relevant Medications   nitrofurantoin, macrocrystal-monohydrate, (MACROBID) 100 MG capsule   Encounter to establish care          No orders of the defined types were placed in this encounter.   Follow-up: Return in about 3 months (around 09/21/2020) for t2dm.   PLAN  A1c elevated to 10.3 - start farxiga 66m PO qd. Return for check in 3 mo  BV: metronidazole 5035mPO bid for 7 days  Candidal infection - diflucan 1506mnce  UTI: macrobid 100m62m bid for 5 days  Continue enalapril 2.5mg 59mevery other day  Discussed lifestyle modifications  Patient encouraged to call clinic with any questions, comments, or concerns.  RichaMaximiano Coss

## 2020-06-21 NOTE — Patient Instructions (Signed)
° ° ° °  If you have lab work done today you will be contacted with your lab results within the next 2 weeks.  If you have not heard from us then please contact us. The fastest way to get your results is to register for My Chart. ° ° °IF you received an x-ray today, you will receive an invoice from [facility]. Please contact [facility] at [phone] with questions or concerns regarding your invoice.  ° °IF you received labwork today, you will receive an invoice from [facility]. Please contact [facility] at [phone] with questions or concerns regarding your invoice.  ° °Our billing staff will not be able to assist you with questions regarding bills from these companies. ° °You will be contacted with the lab results as soon as they are available. The fastest way to get your results is to activate your My Chart account. Instructions are located on the last page of this paperwork. If you have not heard from us regarding the results in 2 weeks, please contact this office. °  ° ° ° °

## 2020-06-24 LAB — URINE CULTURE

## 2020-06-27 ENCOUNTER — Telehealth (INDEPENDENT_AMBULATORY_CARE_PROVIDER_SITE_OTHER): Payer: BC Managed Care – PPO | Admitting: Registered Nurse

## 2020-06-27 ENCOUNTER — Other Ambulatory Visit: Payer: Self-pay

## 2020-06-27 DIAGNOSIS — T50905A Adverse effect of unspecified drugs, medicaments and biological substances, initial encounter: Secondary | ICD-10-CM

## 2020-06-27 DIAGNOSIS — I10 Essential (primary) hypertension: Secondary | ICD-10-CM

## 2020-06-27 DIAGNOSIS — E119 Type 2 diabetes mellitus without complications: Secondary | ICD-10-CM | POA: Diagnosis not present

## 2020-06-27 MED ORDER — ENALAPRIL MALEATE 2.5 MG PO TABS: 2.5000 mg | ORAL_TABLET | ORAL | 1 refills | Status: DC

## 2020-06-27 MED ORDER — GLIPIZIDE-METFORMIN HCL 2.5-500 MG PO TABS: 1.0000 | ORAL_TABLET | Freq: Two times a day (BID) | ORAL | 0 refills | Status: DC

## 2020-06-27 NOTE — Patient Instructions (Signed)
° ° ° °  If you have lab work done today you will be contacted with your lab results within the next 2 weeks.  If you have not heard from us then please contact us. The fastest way to get your results is to register for My Chart. ° ° °IF you received an x-ray today, you will receive an invoice from [facility]. Please contact [facility] at [phone] with questions or concerns regarding your invoice.  ° °IF you received labwork today, you will receive an invoice from [facility]. Please contact [facility] at [phone] with questions or concerns regarding your invoice.  ° °Our billing staff will not be able to assist you with questions regarding bills from these companies. ° °You will be contacted with the lab results as soon as they are available. The fastest way to get your results is to activate your My Chart account. Instructions are located on the last page of this paperwork. If you have not heard from us regarding the results in 2 weeks, please contact this office. °  ° ° ° °

## 2020-07-04 ENCOUNTER — Other Ambulatory Visit: Payer: Self-pay | Admitting: Registered Nurse

## 2020-07-04 DIAGNOSIS — R7989 Other specified abnormal findings of blood chemistry: Secondary | ICD-10-CM

## 2020-07-04 DIAGNOSIS — E1169 Type 2 diabetes mellitus with other specified complication: Secondary | ICD-10-CM

## 2020-07-04 DIAGNOSIS — E785 Hyperlipidemia, unspecified: Secondary | ICD-10-CM

## 2020-07-04 MED ORDER — PRAVASTATIN SODIUM 40 MG PO TABS: 40.0000 mg | ORAL_TABLET | Freq: Every day | ORAL | 3 refills | Status: DC

## 2020-07-05 MED FILL — PRAVASTATIN NA 40 MG TAB: 40 | 90 days supply | Qty: 90 | Fill #0

## 2020-07-25 ENCOUNTER — Encounter: Payer: Self-pay | Admitting: Registered Nurse

## 2020-07-25 ENCOUNTER — Ambulatory Visit
Admission: RE | Admit: 2020-07-25 | Discharge: 2020-07-25 | Disposition: A | Discharge status: Home / Self Care | Payer: BC Managed Care – PPO | Source: Ambulatory Visit | Attending: Registered Nurse | Admitting: Registered Nurse

## 2020-07-25 ENCOUNTER — Other Ambulatory Visit: Payer: Self-pay | Admitting: Registered Nurse

## 2020-07-25 DIAGNOSIS — E1169 Type 2 diabetes mellitus with other specified complication: Secondary | ICD-10-CM

## 2020-07-25 DIAGNOSIS — R7989 Other specified abnormal findings of blood chemistry: Secondary | ICD-10-CM

## 2020-07-25 DIAGNOSIS — E785 Hyperlipidemia, unspecified: Secondary | ICD-10-CM

## 2020-07-25 DIAGNOSIS — K769 Liver disease, unspecified: Secondary | ICD-10-CM

## 2020-07-25 DIAGNOSIS — K760 Fatty (change of) liver, not elsewhere classified: Secondary | ICD-10-CM | POA: Diagnosis not present

## 2020-07-25 MED ORDER — PRAVASTATIN SODIUM 40 MG PO TABS
40.0000 mg | ORAL_TABLET | Freq: Every day | ORAL | 3 refills | Status: DC
Start: 2020-07-25 — End: 2021-07-27

## 2020-07-26 ENCOUNTER — Telehealth: Payer: Self-pay | Admitting: Registered Nurse

## 2020-07-26 NOTE — Telephone Encounter (Signed)
Gabrielle Edwards called from [facility] to inform provider that the imaging request for code 91660 does not require authorization.Gabrielle Edwards called from [facility] to inform provider that the imaging request for code 60045 does not require authorization.

## 2020-08-04 ENCOUNTER — Other Ambulatory Visit: Payer: BC Managed Care – PPO

## 2020-09-20 ENCOUNTER — Encounter: Payer: BC Managed Care – PPO | Admitting: Registered Nurse

## 2020-09-22 ENCOUNTER — Other Ambulatory Visit: Payer: Self-pay | Admitting: Registered Nurse

## 2020-09-22 DIAGNOSIS — E119 Type 2 diabetes mellitus without complications: Secondary | ICD-10-CM

## 2020-09-28 DIAGNOSIS — E119 Type 2 diabetes mellitus without complications: Secondary | ICD-10-CM | POA: Insufficient documentation

## 2020-09-28 DIAGNOSIS — I10 Essential (primary) hypertension: Secondary | ICD-10-CM | POA: Insufficient documentation

## 2020-09-28 NOTE — Progress Notes (Signed)
Telemedicine Encounter- SOAP NOTE Established Patient  This telephone encounter was conducted with the patient's (or proxy's) verbal consent via audio telecommunications: yes  Patient was instructed to have this encounter in a suitably private space; and to only have persons present to whom they give permission to participate. In addition, patient identity was confirmed by use of name plus two identifiers (DOB and address).  I discussed the limitations, risks, security and privacy concerns of performing an evaluation and management service by telephone and the availability of in person appointments. I also discussed with the patient that there may be a patient responsible charge related to this service. The patient expressed understanding and agreed to proceed.  I spent a total of 15 minutes talking with the patient or their proxy.  Patient at home Provider in office  Chief Complaint  Patient presents with  . Allergic Reaction    Patient states she was prescribed farxiga .Patient states she was itching all over her body and had a rash that has now went away. Per patient she stopped  taking the medication and was not itching as bad since.    Subjective   Gabrielle Edwards is a 30 y.o. established patient. Telephone visit today for medication reaction  HPI Uncontrolled t2dm. Had started [place] for patient, unfortunately after initial dose had full body itching. Stopped taking medication. Used otcs to help itching. No breathing issues. No nvd. Itching much improved. No lasting rash  Does note that she needs refill on enalapril. No AEs. Good compliance.   Otherwise no concerns.   Patient Active Problem List   Diagnosis Date Noted  . Elevated liver enzymes 11/25/2019  . History of gestational diabetes 03/12/2016    Past Medical History:  Diagnosis Date  . BV (bacterial vaginosis)   . Gestational diabetes    2015 and currently glyburide  . Hormone imbalance   . Infection    uti   . Normal first pregnancy confirmed, currently in third trimester 07/16/2014  . SVD (spontaneous vaginal delivery) 07/17/2014    Current Outpatient Medications  Medication Sig Dispense Refill  . acetaminophen (TYLENOL) 500 MG tablet Take 1,000 mg by mouth every 6 (six) hours as needed for mild pain or headache.    . Blood Glucose Monitoring Suppl (ACCU-CHEK AVIVA PLUS) w/Device KIT 1 Device by Does not apply route as directed. 1 kit 0  . enalapril (VASOTEC) 2.5 MG tablet Take 1 tablet (2.5 mg total) by mouth every other day. 90 tablet 1  . glucose blood (RELION PREMIER TEST) test strip Use as instructed 100 each 12  . ibuprofen (ADVIL,MOTRIN) 800 MG tablet Take 1 tablet (800 mg total) by mouth every 8 (eight) hours as needed. 30 tablet 0  . glipiZIDE-metformin (METAGLIP) 2.5-500 MG tablet TAKE 1 TABLET BY MOUTH TWICE DAILY BEFORE A MEAL 180 tablet 0  . pravastatin (PRAVACHOL) 40 MG tablet Take 1 tablet (40 mg total) by mouth daily. 90 tablet 3   No current facility-administered medications for this visit.    Allergies  Allergen Reactions  . Farxiga [Dapagliflozin] Hives and Itching  . Metformin And Related Diarrhea and Nausea Only    Social History   Socioeconomic History  . Marital status: Single    Spouse name: Not on file  . Number of children: Not on file  . Years of education: Not on file  . Highest education level: Not on file  Occupational History  . Not on file  Tobacco Use  . Smoking  status: Never Smoker  . Smokeless tobacco: Never Used  Vaping Use  . Vaping Use: Never used  Substance and Sexual Activity  . Alcohol use: No    Alcohol/week: 0.0 standard drinks  . Drug use: No  . Sexual activity: Yes    Birth control/protection: None, Surgical  Other Topics Concern  . Not on file  Social History Narrative  . Not on file   Social Determinants of Health   Financial Resource Strain: Not on file  Food Insecurity: Not on file  Transportation Needs: Not on file   Physical Activity: Not on file  Stress: Not on file  Social Connections: Not on file  Intimate Partner Violence: Not on file    Review of Systems  Constitutional: Negative.   HENT: Negative.   Eyes: Negative.   Respiratory: Negative.   Cardiovascular: Negative.   Gastrointestinal: Negative.   Genitourinary: Negative.   Musculoskeletal: Negative.   Skin: Positive for itching.  Neurological: Negative.   Endo/Heme/Allergies: Negative.   Psychiatric/Behavioral: Negative.     Objective   Vitals as reported by the patient: There were no vitals filed for this visit.  Karlene Einstein was seen today for allergic reaction.  Diagnoses and all orders for this visit:  Adverse effect of drug, initial encounter  Type 2 diabetes mellitus without complication, without long-term current use of insulin (HCC) -     Discontinue: glipiZIDE-metformin (METAGLIP) 2.5-500 MG tablet; Take 1 tablet by mouth 2 (two) times daily before a meal.  Essential hypertension -     enalapril (VASOTEC) 2.5 MG tablet; Take 1 tablet (2.5 mg total) by mouth every other day.   PLAN  Continue to monitor closely for any delayed reaction. ER immediately if anything arises.   Stop [place]. Double down on diet and exercise control for improving A1c.  Refill metaglip and vasotec  Return as scheduled for check on t2dm  Patient encouraged to call clinic with any questions, comments, or concerns.   I discussed the assessment and treatment plan with the patient. The patient was provided an opportunity to ask questions and all were answered. The patient agreed with the plan and demonstrated an understanding of the instructions.   The patient was advised to call back or seek an in-person evaluation if the symptoms worsen or if the condition fails to improve as anticipated.  I provided 15 minutes of non-face-to-face time during this encounter.  Maximiano Coss, NP  [facility] at [facility]

## 2020-10-20 ENCOUNTER — Encounter: Payer: Self-pay | Admitting: Registered Nurse

## 2020-10-24 ENCOUNTER — Other Ambulatory Visit: Payer: Self-pay

## 2020-10-24 ENCOUNTER — Ambulatory Visit (INDEPENDENT_AMBULATORY_CARE_PROVIDER_SITE_OTHER): Payer: BC Managed Care – PPO | Admitting: Registered Nurse

## 2020-10-24 ENCOUNTER — Encounter: Payer: Self-pay | Admitting: Registered Nurse

## 2020-10-24 VITALS — BP 122/78 | HR 100 | Temp 98.3°F | Resp 18 | Ht 60.0 in | Wt 196.6 lb

## 2020-10-24 DIAGNOSIS — Z0001 Encounter for general adult medical examination with abnormal findings: Secondary | ICD-10-CM | POA: Diagnosis not present

## 2020-10-24 DIAGNOSIS — Z1329 Encounter for screening for other suspected endocrine disorder: Secondary | ICD-10-CM | POA: Diagnosis not present

## 2020-10-24 DIAGNOSIS — Z13228 Encounter for screening for other metabolic disorders: Secondary | ICD-10-CM | POA: Diagnosis not present

## 2020-10-24 DIAGNOSIS — Z0000 Encounter for general adult medical examination without abnormal findings: Secondary | ICD-10-CM

## 2020-10-24 DIAGNOSIS — N939 Abnormal uterine and vaginal bleeding, unspecified: Secondary | ICD-10-CM

## 2020-10-24 DIAGNOSIS — Z130 Encounter for screening for diseases of the blood and blood-forming organs and certain disorders involving the immune mechanism: Secondary | ICD-10-CM | POA: Diagnosis not present

## 2020-10-24 DIAGNOSIS — Z840 Family history of diseases of the skin and subcutaneous tissue: Secondary | ICD-10-CM | POA: Diagnosis not present

## 2020-10-24 DIAGNOSIS — Z13220 Encounter for screening for lipoid disorders: Secondary | ICD-10-CM

## 2020-10-24 DIAGNOSIS — N921 Excessive and frequent menstruation with irregular cycle: Secondary | ICD-10-CM | POA: Diagnosis not present

## 2020-10-24 NOTE — Patient Instructions (Signed)
° ° ° °  If you have lab work done today you will be contacted with your lab results within the next 2 weeks.  If you have not heard from us then please contact us. The fastest way to get your results is to register for My Chart. ° ° °IF you received an x-ray today, you will receive an invoice from [facility]. Please contact [facility] at [phone] with questions or concerns regarding your invoice.  ° °IF you received labwork today, you will receive an invoice from [facility]. Please contact [facility] at [phone] with questions or concerns regarding your invoice.  ° °Our billing staff will not be able to assist you with questions regarding bills from these companies. ° °You will be contacted with the lab results as soon as they are available. The fastest way to get your results is to activate your My Chart account. Instructions are located on the last page of this paperwork. If you have not heard from us regarding the results in 2 weeks, please contact this office. °  ° ° ° °

## 2020-10-24 NOTE — Progress Notes (Signed)
Established Patient Office Visit  Subjective:  Patient ID: Gabrielle Edwards, female    DOB: 08-16-1990  Age: 30 y.o. MRN: [id number]  CC:  Chief Complaint  Patient presents with  . Annual Exam    Patient states she is here for a CPE    HPI Gabrielle Edwards presents for CPE and labs  Does note heavy menses lately -  Usually on regular cycle, 4 days moderate bleeding then 2-3 days of light bleeding Last menses started 14 days ago, has been heavy at times, many large clots of 2-3". Generally lighter now but still ongoing. Some cramping but nothing more than normal menses. Did have tubal ligation following previous pregnancy, no new sexual partners. Has been somewhat fatigued but attributes this to menstruation. Otherwise no new symptoms. Does note fam hx of heavy menses including in her own mother, who ended up with a complete hysterectomy around age of menopause.   She also notes an aunt who had lupus, and unfortunately succumbed to the disease. As her family is from the [place], she is unsure if this was preventable if there had been better access to care or if the disease burden was too much. Interested in checking [facility] today as she is aware family hx puts her at higher risk.   Past Medical History:  Diagnosis Date  . BV (bacterial vaginosis)   . Gestational diabetes    2015 and currently glyburide  . Hormone imbalance   . Infection    uti  . Normal first pregnancy confirmed, currently in third trimester 07/16/2014  . SVD (spontaneous vaginal delivery) 07/17/2014    Past Surgical History:  Procedure Laterality Date  . CESAREAN SECTION N/A 01/25/2016   Procedure: CESAREAN SECTION;  Surgeon: Truett Mainland, DO;  Location: [facility];  Service: Obstetrics;  Laterality: N/A;  . TUBAL LIGATION      Family History  Problem Relation Age of Onset  . Hypertension Father   . Lupus Maternal Aunt   . Kidney disease Maternal Grandmother   . Diabetes Maternal Grandmother   .  Heart disease Paternal Grandfather   . Diabetes Maternal Grandfather   . Hypertension Maternal Grandfather   . Diabetes Paternal Grandmother   . Hypertension Paternal Grandmother     Social History   Socioeconomic History  . Marital status: Single    Spouse name: Not on file  . Number of children: Not on file  . Years of education: Not on file  . Highest education level: Not on file  Occupational History  . Not on file  Tobacco Use  . Smoking status: Never Smoker  . Smokeless tobacco: Never Used  Vaping Use  . Vaping Use: Never used  Substance and Sexual Activity  . Alcohol use: No    Alcohol/week: 0.0 standard drinks  . Drug use: No  . Sexual activity: Yes    Birth control/protection: None, Surgical  Other Topics Concern  . Not on file  Social History Narrative  . Not on file   Social Determinants of Health   Financial Resource Strain: Not on file  Food Insecurity: Not on file  Transportation Needs: Not on file  Physical Activity: Not on file  Stress: Not on file  Social Connections: Not on file  Intimate Partner Violence: Not on file    Outpatient Medications Prior to Visit  Medication Sig Dispense Refill  . acetaminophen (TYLENOL) 500 MG tablet Take 1,000 mg by mouth every 6 (six) hours as needed for  mild pain or headache.    . Blood Glucose Monitoring Suppl (ACCU-CHEK AVIVA PLUS) w/Device KIT 1 Device by Does not apply route as directed. 1 kit 0  . enalapril (VASOTEC) 2.5 MG tablet Take 1 tablet (2.5 mg total) by mouth every other day. 90 tablet 1  . glipiZIDE-metformin (METAGLIP) 2.5-500 MG tablet TAKE 1 TABLET BY MOUTH TWICE DAILY BEFORE A MEAL 180 tablet 0  . glucose blood (RELION PREMIER TEST) test strip Use as instructed 100 each 12  . ibuprofen (ADVIL,MOTRIN) 800 MG tablet Take 1 tablet (800 mg total) by mouth every 8 (eight) hours as needed. 30 tablet 0  . pravastatin (PRAVACHOL) 40 MG tablet Take 1 tablet (40 mg total) by mouth daily. 90 tablet 3   No  facility-administered medications prior to visit.    Allergies  Allergen Reactions  . Farxiga [Dapagliflozin] Hives and Itching  . Metformin And Related Diarrhea and Nausea Only    ROS Review of Systems  Constitutional: Negative.   HENT: Negative.   Eyes: Negative.   Respiratory: Negative.   Cardiovascular: Negative.   Gastrointestinal: Negative.   Genitourinary: Negative.   Musculoskeletal: Negative.   Skin: Negative.   Neurological: Negative.   Psychiatric/Behavioral: Negative.   All other systems reviewed and are negative.     Objective:    Physical Exam Vitals and nursing note reviewed.  Constitutional:      General: She is not in acute distress.    Appearance: Normal appearance. She is normal weight. She is not ill-appearing, toxic-appearing or diaphoretic.  HENT:     Head: Normocephalic and atraumatic.     Right Ear: Tympanic membrane, ear canal and external ear normal. There is no impacted cerumen.     Left Ear: Tympanic membrane, ear canal and external ear normal. There is no impacted cerumen.     Nose: Nose normal. No congestion or rhinorrhea.     Mouth/Throat:     Mouth: Mucous membranes are moist.     Pharynx: Oropharynx is clear. No oropharyngeal exudate or posterior oropharyngeal erythema.  Eyes:     General: No scleral icterus.       Right eye: No discharge.        Left eye: No discharge.     Extraocular Movements: Extraocular movements intact.     Conjunctiva/sclera: Conjunctivae normal.     Pupils: Pupils are equal, round, and reactive to light.  Cardiovascular:     Rate and Rhythm: Normal rate and regular rhythm.     Pulses: Normal pulses.     Heart sounds: Normal heart sounds. No murmur heard. No friction rub. No gallop.   Pulmonary:     Effort: Pulmonary effort is normal. No respiratory distress.     Breath sounds: Normal breath sounds. No stridor. No wheezing, rhonchi or rales.  Chest:     Chest wall: No tenderness.  Abdominal:      General: Abdomen is flat. Bowel sounds are normal. There is no distension.     Palpations: Abdomen is soft. There is no mass.     Tenderness: There is no abdominal tenderness. There is no right CVA tenderness, left CVA tenderness, guarding or rebound.     Hernia: No hernia is present.  Musculoskeletal:        General: No swelling, tenderness, deformity or signs of injury. Normal range of motion.     Right lower leg: No edema.     Left lower leg: No edema.  Skin:    General:  Skin is warm and dry.     Capillary Refill: Capillary refill takes less than 2 seconds.     Coloration: Skin is not jaundiced or pale.     Findings: No bruising, erythema, lesion or rash.  Neurological:     General: No focal deficit present.     Mental Status: She is alert and oriented to person, place, and time. Mental status is at baseline.     Cranial Nerves: No cranial nerve deficit.     Sensory: No sensory deficit.     Motor: No weakness.     Coordination: Coordination normal.     Gait: Gait normal.     Deep Tendon Reflexes: Reflexes normal.  Psychiatric:        Mood and Affect: Mood normal.        Behavior: Behavior normal.        Thought Content: Thought content normal.        Judgment: Judgment normal.     BP 122/78   Pulse 100   Temp 98.3 F (36.8 C) (Temporal)   Resp 18   Ht 5' (1.524 m)   Wt 196 lb 9.6 oz (89.2 kg)   SpO2 100%   BMI 38.40 kg/m  Wt Readings from Last 3 Encounters:  10/24/20 196 lb 9.6 oz (89.2 kg)  06/21/20 198 lb (89.8 kg)  11/25/19 191 lb 3.2 oz (86.7 kg)     There are no preventive care reminders to display for this patient.  There are no preventive care reminders to display for this patient.  Lab Results  Component Value Date   TSH 2.870 11/18/2019   Lab Results  Component Value Date   WBC 11.4 (H) 06/21/2020   HGB 13.0 06/21/2020   HCT 41.3 06/21/2020   MCV 78 (L) 06/21/2020   PLT 288 06/21/2020   Lab Results  Component Value Date   NA 133 (L)  06/21/2020   K 4.1 06/21/2020   CO2 22 06/21/2020   GLUCOSE 380 (H) 06/21/2020   BUN 8 06/21/2020   CREATININE 0.58 06/21/2020   BILITOT 0.2 06/21/2020   ALKPHOS 140 (H) 06/21/2020   AST 122 (H) 06/21/2020   ALT 169 (H) 06/21/2020   PROT 7.8 06/21/2020   ALBUMIN 4.6 06/21/2020   CALCIUM 9.9 06/21/2020   Lab Results  Component Value Date   CHOL 244 (H) 06/21/2020   Lab Results  Component Value Date   HDL 49 06/21/2020   Lab Results  Component Value Date   LDLCALC 142 (H) 06/21/2020   Lab Results  Component Value Date   TRIG 290 (H) 06/21/2020   Lab Results  Component Value Date   CHOLHDL 5.0 (H) 06/21/2020   Lab Results  Component Value Date   HGBA1C 10.3 (A) 06/21/2020      Assessment & Plan:   Problem List Items Addressed This Visit   None   Visit Diagnoses    Annual physical exam    -  Primary   Screening for endocrine, metabolic and immunity disorder       Relevant Orders   Comprehensive metabolic panel   Hemoglobin A1c   TSH   CBC with Differential/Platelet   Lipid screening       Relevant Orders   Lipid panel   Abnormal uterine bleeding (AUB)       Relevant Orders   Estrogens, total   Prolactin   Antinuclear Antib (ANA)   C-reactive protein   Ambulatory referral to Gynecology  US Pelvic Complete With Transvaginal   Menorrhagia with irregular cycle       Relevant Orders   Estrogens, total   Prolactin   Antinuclear Antib (ANA)   C-reactive protein   Ambulatory referral to Gynecology   US Pelvic Complete With Transvaginal   Family history of lupus erythematosus       Relevant Orders   Antinuclear Antib (ANA)   C-reactive protein      No orders of the defined types were placed in this encounter.   Follow-up: No follow-ups on file.   PLAN  No acute concerns on exam  AUB concerning. Will draw labs today. If wnl can consider 2-3 mo of COCs to help control. Will order US pelvis complete with transvaginal given extensive clotting  and refer to Gyn for further work up.  Close follow up based on results. ER precautions reviewed. Doubt pregnancy but continue to monitor. Worsening pain or bleeding warrants ER visit.  Patient encouraged to call clinic with any questions, comments, or concerns.  Maximiano Coss, NP

## 2020-10-25 LAB — COMPREHENSIVE METABOLIC PANEL
ALT: 133 U/L — ABNORMAL HIGH (ref 0–35)
AST: 166 U/L — ABNORMAL HIGH (ref 0–37)
Albumin: 4.2 g/dL (ref 3.5–5.2)
Alkaline Phosphatase: 95 U/L (ref 39–117)
BUN: 8 mg/dL (ref 6–23)
CO2: 24 mEq/L (ref 19–32)
Calcium: 9 mg/dL (ref 8.4–10.5)
Chloride: 100 mEq/L (ref 96–112)
Creatinine, Ser: 0.44 mg/dL (ref 0.40–1.20)
GFR: 129.87 mL/min (ref >60.00)
Glucose, Bld: 221 mg/dL — ABNORMAL HIGH (ref 70–99)
Potassium: 3.9 mEq/L (ref 3.5–5.1)
Sodium: 136 mEq/L (ref 135–145)
Total Bilirubin: 0.4 mg/dL (ref 0.2–1.2)
Total Protein: 7.7 g/dL (ref 6.0–8.3)

## 2020-10-25 LAB — CBC WITH DIFFERENTIAL/PLATELET
Basophils Absolute: 0.1 10*3/uL (ref 0.0–0.1)
Basophils Relative: 0.9 % (ref 0.0–3.0)
Eosinophils Absolute: 0.2 10*3/uL (ref 0.0–0.7)
Eosinophils Relative: 2.9 % (ref 0.0–5.0)
HCT: 26.4 % — ABNORMAL LOW (ref 36.0–46.0)
Hemoglobin: 8.8 g/dL — ABNORMAL LOW (ref 12.0–15.0)
Lymphocytes Relative: 33.4 % (ref 12.0–46.0)
Lymphs Abs: 2.7 10*3/uL (ref 0.7–4.0)
MCHC: 33.2 g/dL (ref 30.0–36.0)
MCV: 78 fl (ref 78.0–100.0)
Monocytes Absolute: 0.3 10*3/uL (ref 0.1–1.0)
Monocytes Relative: 4.1 % (ref 3.0–12.0)
Neutro Abs: 4.7 10*3/uL (ref 1.4–7.7)
Neutrophils Relative %: 58.7 % (ref 43.0–77.0)
Platelets: 340 10*3/uL (ref 150.0–400.0)
RBC: 3.39 Mil/uL — ABNORMAL LOW (ref 3.87–5.11)
RDW: 14.4 % (ref 11.5–15.5)
WBC: 8 10*3/uL (ref 4.0–10.5)

## 2020-10-25 LAB — LIPID PANEL
Cholesterol: 210 mg/dL — ABNORMAL HIGH (ref 0–200)
HDL: 51.4 mg/dL (ref >39.00)
LDL Cholesterol: 130 mg/dL — ABNORMAL HIGH (ref 0–99)
NonHDL: 159
Total CHOL/HDL Ratio: 4
Triglycerides: 144 mg/dL (ref 0.0–149.0)
VLDL: 28.8 mg/dL (ref 0.0–40.0)

## 2020-10-25 LAB — TSH: TSH: 2.46 u[IU]/mL (ref 0.35–4.50)

## 2020-10-25 LAB — C-REACTIVE PROTEIN: CRP: 1.6 mg/dL (ref 0.5–20.0)

## 2020-10-25 LAB — HEMOGLOBIN A1C: Hgb A1c MFr Bld: 8.5 % — ABNORMAL HIGH (ref 4.6–6.5)

## 2020-10-29 LAB — ESTROGENS, TOTAL: Estrogen: 205.9 pg/mL

## 2020-10-29 LAB — EXTRA SPECIMEN

## 2020-10-29 LAB — PROLACTIN: Prolactin: 5.2 ng/mL

## 2020-10-29 LAB — ANA: Anti Nuclear Antibody (ANA): NEGATIVE

## 2020-10-30 ENCOUNTER — Other Ambulatory Visit: Payer: Self-pay | Admitting: Registered Nurse

## 2020-10-30 DIAGNOSIS — E119 Type 2 diabetes mellitus without complications: Secondary | ICD-10-CM

## 2020-11-08 ENCOUNTER — Ambulatory Visit
Admission: RE | Admit: 2020-11-08 | Discharge: 2020-11-08 | Disposition: A | Discharge status: Home / Self Care | Payer: BC Managed Care – PPO | Source: Ambulatory Visit | Attending: Registered Nurse | Admitting: Registered Nurse

## 2020-11-08 DIAGNOSIS — N921 Excessive and frequent menstruation with irregular cycle: Secondary | ICD-10-CM

## 2020-11-08 DIAGNOSIS — N939 Abnormal uterine and vaginal bleeding, unspecified: Secondary | ICD-10-CM

## 2020-11-08 DIAGNOSIS — N920 Excessive and frequent menstruation with regular cycle: Secondary | ICD-10-CM | POA: Diagnosis not present

## 2020-11-22 ENCOUNTER — Ambulatory Visit (INDEPENDENT_AMBULATORY_CARE_PROVIDER_SITE_OTHER): Payer: BC Managed Care – PPO | Admitting: Obstetrics and Gynecology

## 2020-11-22 ENCOUNTER — Encounter: Payer: Self-pay | Admitting: Obstetrics and Gynecology

## 2020-11-22 ENCOUNTER — Other Ambulatory Visit: Payer: Self-pay

## 2020-11-22 VITALS — BP 127/86 | HR 81 | Wt 196.1 lb

## 2020-11-22 DIAGNOSIS — N939 Abnormal uterine and vaginal bleeding, unspecified: Secondary | ICD-10-CM | POA: Diagnosis not present

## 2020-11-22 MED ORDER — MEGESTROL ACETATE 40 MG PO TABS: 40.0000 mg | ORAL_TABLET | Freq: Two times a day (BID) | ORAL | 5 refills | Status: DC

## 2020-11-22 NOTE — Progress Notes (Signed)
30 yo P2 here for the evaluation of AUB. Patient reports a history of a monthly 7-day period. There has been occasions where she skipped for 2 months. Patient reports that since March she has experienced a heavy period with passage of large clots. Her period lasted over 2 weeks. Her period restarted again today with large clots. Patient is sexually active using BTL for contraception. She denies abnormal discharge or pelvic pain  Past Medical History:  Diagnosis Date  . BV (bacterial vaginosis)   . Gestational diabetes    2015 and currently glyburide  . Hormone imbalance   . Infection    uti  . Normal first pregnancy confirmed, currently in third trimester 07/16/2014  . SVD (spontaneous vaginal delivery) 07/17/2014   Past Surgical History:  Procedure Laterality Date  . CESAREAN SECTION N/A 01/25/2016   Procedure: CESAREAN SECTION;  Surgeon: Levie Heritage, DO;  Location: [facility];  Service: Obstetrics;  Laterality: N/A;  . TUBAL LIGATION     Family History  Problem Relation Age of Onset  . Hypertension Father   . Lupus Maternal Aunt   . Kidney disease Maternal Grandmother   . Diabetes Maternal Grandmother   . Heart disease Paternal Grandfather   . Diabetes Maternal Grandfather   . Hypertension Maternal Grandfather   . Diabetes Paternal Grandmother   . Hypertension Paternal Grandmother    Social History   Tobacco Use  . Smoking status: Never Smoker  . Smokeless tobacco: Never Used  Vaping Use  . Vaping Use: Never used  Substance Use Topics  . Alcohol use: No    Alcohol/week: 0.0 standard drinks  . Drug use: No   ROS See pertinent in HPI. All other systems reviewed and non contributory  Blood pressure 127/86, pulse 81, weight 196 lb 1.6 oz (89 kg), last menstrual period 11/22/2020, not currently breastfeeding. GENERAL: Well-developed, well-nourished female in no acute distress.  NEURO: alert and oriented x3  Patient declined exam today  US Pelvic Complete  With Transvaginal  Result Date: 11/10/2020 CLINICAL DATA:  Abnormal uterine bleeding, menorrhagia with irregular cycle, LMP 10/10/2020 EXAM: TRANSABDOMINAL AND TRANSVAGINAL ULTRASOUND OF PELVIS TECHNIQUE: Both transabdominal and transvaginal ultrasound examinations of the pelvis were performed. Transabdominal technique was performed for global imaging of the pelvis including uterus, ovaries, adnexal regions, and pelvic cul-de-sac. It was necessary to proceed with endovaginal exam following the transabdominal exam to visualize the uterus, endometrium, and LEFT ovary. COMPARISON:  None FINDINGS: Uterus Measurements: 5.3 x 5.1 x 5.6 cm = volume: 137 mL. Anteverted. Anterior wall Caesarean section scar per questionable small anterior wall subserosal leiomyoma 13 mm diameter versus area of deformity related to adjacent Caesarean section scar. No additional uterine mass. Endometrium Thickness: 9 mm.  No endometrial fluid or focal abnormality Right ovary Measurements: 4.9 x 2.4 x 2.6 cm = volume: 16.4 mL. Normal morphology without mass Left ovary Not visualized, likely obscured by bowel Other findings No free pelvic fluid.  No adnexal masses. IMPRESSION: Nonvisualization of LEFT ovary. Questionable small subserosal leiomyoma at anterior wall of uterus versus area of deformity related to adjacent Caesarean section scar. Remainder of exam unremarkable. Electronically Signed   By: Ulyses Southward M.D.   On: 11/10/2020 09:08   A/P 30 yo with AUB - Discussed benefits of endometrial biopsy and patient declined. Patient agrees to return in a few months for biopsy - Discussed medical management with Megace for now and patient agreed - If no improvement in AUB, patient plans to return for  further evaluation and management - RTC prn

## 2020-12-20 ENCOUNTER — Telehealth: Payer: Self-pay | Admitting: Registered Nurse

## 2020-12-20 ENCOUNTER — Other Ambulatory Visit: Payer: Self-pay

## 2020-12-20 DIAGNOSIS — I10 Essential (primary) hypertension: Secondary | ICD-10-CM

## 2020-12-20 DIAGNOSIS — E119 Type 2 diabetes mellitus without complications: Secondary | ICD-10-CM

## 2020-12-20 MED ORDER — GLIPIZIDE-METFORMIN HCL 2.5-500 MG PO TABS
1.0000 | ORAL_TABLET | Freq: Two times a day (BID) | ORAL | 1 refills | Status: DC
Start: 2020-12-20 — End: 2021-08-07

## 2020-12-20 MED ORDER — ENALAPRIL MALEATE 2.5 MG PO TABS: 2.5000 mg | ORAL_TABLET | ORAL | 1 refills | Status: DC

## 2020-12-20 NOTE — Telephone Encounter (Signed)
Pt called in asking for a refill on the glipizide metformin and the enalapril. [facility] uses [facility] [address].  Pt can be reached at the home #

## 2020-12-20 NOTE — Telephone Encounter (Signed)
Medication sent to pharmacy  

## 2021-07-27 ENCOUNTER — Other Ambulatory Visit: Payer: Self-pay | Admitting: Registered Nurse

## 2021-07-27 DIAGNOSIS — E1169 Type 2 diabetes mellitus with other specified complication: Secondary | ICD-10-CM

## 2021-07-27 DIAGNOSIS — E785 Hyperlipidemia, unspecified: Secondary | ICD-10-CM

## 2021-07-27 DIAGNOSIS — I10 Essential (primary) hypertension: Secondary | ICD-10-CM

## 2021-08-07 ENCOUNTER — Telehealth: Payer: Self-pay | Admitting: Registered Nurse

## 2021-08-07 ENCOUNTER — Other Ambulatory Visit: Payer: Self-pay

## 2021-08-07 DIAGNOSIS — I10 Essential (primary) hypertension: Secondary | ICD-10-CM

## 2021-08-07 DIAGNOSIS — E119 Type 2 diabetes mellitus without complications: Secondary | ICD-10-CM

## 2021-08-07 MED ORDER — ENALAPRIL MALEATE 2.5 MG PO TABS: 2.5000 mg | ORAL_TABLET | ORAL | 0 refills | Status: DC

## 2021-08-07 MED ORDER — GLIPIZIDE-METFORMIN HCL 2.5-500 MG PO TABS: 1.0000 | ORAL_TABLET | Freq: Two times a day (BID) | ORAL | 1 refills | Status: DC

## 2021-08-07 NOTE — Telephone Encounter (Signed)
Pt called in asking for a new script of the glipizide and enalapril sent to [facility] on [address].  Please advise   Pt states that she doesn't use [facility].    Last office visit 10/25/21

## 2021-08-07 NOTE — Telephone Encounter (Signed)
Rx sent to correct pharmacy.

## 2021-09-10 ENCOUNTER — Encounter: Payer: Self-pay | Admitting: Obstetrics and Gynecology

## 2021-09-10 ENCOUNTER — Ambulatory Visit: Payer: BC Managed Care – PPO | Admitting: Obstetrics and Gynecology

## 2021-09-10 ENCOUNTER — Other Ambulatory Visit (HOSPITAL_COMMUNITY)
Admission: RE | Admit: 2021-09-10 | Discharge: 2021-09-10 | Disposition: A | Discharge status: Home / Self Care | Payer: BC Managed Care – PPO | Source: Ambulatory Visit | Attending: Obstetrics and Gynecology | Admitting: Obstetrics and Gynecology

## 2021-09-10 ENCOUNTER — Other Ambulatory Visit: Payer: Self-pay

## 2021-09-10 VITALS — BP 127/84 | HR 87 | Wt 192.0 lb

## 2021-09-10 DIAGNOSIS — Z01419 Encounter for gynecological examination (general) (routine) without abnormal findings: Secondary | ICD-10-CM | POA: Insufficient documentation

## 2021-09-10 LAB — POCT URINALYSIS DIPSTICK
Bilirubin, UA: NEGATIVE
Blood, UA: NEGATIVE
Glucose, UA: NEGATIVE
Ketones, UA: NEGATIVE
Leukocytes, UA: NEGATIVE
Nitrite, UA: NEGATIVE
Protein, UA: POSITIVE — AB
Spec Grav, UA: >=1.03 — AB (ref 1.010–1.025)
Urobilinogen, UA: 0.2 E.U./dL
pH, UA: 6 (ref 5.0–8.0)

## 2021-09-10 NOTE — Progress Notes (Signed)
Subjective:     Gabrielle Edwards is a 31 y.o. female P2 with LMP 08/12/21 and BMI 37 who is here for a comprehensive physical exam. The patient reports recent treatment with AZO of urinary symptoms which have since resolved. Patient reports a monthly period lasting 7 days significantly improved from previous. She states the first 2 days are heavy with small clots. She is no longer taking megace. She is sexually active using BTL for contraception. She denies pelvic pain or abnormal discharge. Patient is without any other complaints.  Past Medical History:  Diagnosis Date   BV (bacterial vaginosis)    Gestational diabetes    2015 and currently glyburide   Hormone imbalance    Infection    uti   Normal first pregnancy confirmed, currently in third trimester 07/16/2014   SVD (spontaneous vaginal delivery) 07/17/2014   Past Surgical History:  Procedure Laterality Date   CESAREAN SECTION N/A 01/25/2016   Procedure: CESAREAN SECTION;  Surgeon: Levie Heritage, DO;  Location: [facility]TH[facility];  Service: Obstetrics;  Laterality: N/A;   TUBAL LIGATION     Family History  Problem Relation Age of Onset   Hypertension Father    Lupus Maternal Aunt    Kidney disease Maternal Grandmother    Diabetes Maternal Grandmother    Heart disease Paternal Grandfather    Diabetes Maternal Grandfather    Hypertension Maternal Grandfather    Diabetes Paternal Grandmother    Hypertension Paternal Grandmother     Social History   Socioeconomic History   Marital status: Single    Spouse name: Not on file   Number of children: Not on file   Years of education: Not on file   Highest education level: Not on file  Occupational History   Not on file  Tobacco Use   Smoking status: Never   Smokeless tobacco: Never  Vaping Use   Vaping Use: Never used  Substance and Sexual Activity   Alcohol use: No    Alcohol/week: 0.0 standard drinks   Drug use: No   Sexual activity: Yes    Birth control/protection:  None, Surgical  Other Topics Concern   Not on file  Social History Narrative   Not on file   Social Determinants of Health   Financial Resource Strain: Not on file  Food Insecurity: Food Insecurity Present   Worried About Running Out of Food in the Last Year: Sometimes true   Ran Out of Food in the Last Year: Sometimes true  Transportation Needs: No Transportation Needs   Lack of Transportation (Medical): No   Lack of Transportation (Non-Medical): No  Physical Activity: Not on file  Stress: Not on file  Social Connections: Not on file  Intimate Partner Violence: Not on file   Health Maintenance  Topic Date Due   COVID-19 Vaccine (1) Never done   OPHTHALMOLOGY EXAM  Never done   Hepatitis C Screening  Never done   PAP SMEAR-Modifier  Never done   INFLUENZA VACCINE  02/26/2021   HEMOGLOBIN A1C  04/26/2021   FOOT EXAM  06/21/2021   TETANUS/TDAP  11/08/2025   HIV Screening  Completed   HPV VACCINES  Aged Out       Review of Systems Pertinent items noted in HPI and remainder of comprehensive ROS otherwise negative.   Objective:  Blood pressure 127/84, pulse 87, weight 192 lb (87.1 kg), last menstrual period 08/12/2021.   GENERAL: Well-developed, well-nourished female in no acute distress.  HEENT: Normocephalic, atraumatic. Sclerae  anicteric.  NECK: Supple. Normal thyroid.  LUNGS: Clear to auscultation bilaterally.  HEART: Regular rate and rhythm. BREASTS: Symmetric in size. No palpable masses or lymphadenopathy, skin changes, or nipple drainage. ABDOMEN: Soft, nontender, nondistended. No organomegaly. PELVIC: Normal external female genitalia. Vagina is pink and rugated.  Normal discharge. Normal appearing cervix. Uterus is normal in size. No adnexal mass or tenderness. Chaperone present during the pelvic exam EXTREMITIES: No cyanosis, clubbing, or edema, 2+ distal pulses.     Assessment:    Healthy female exam.      Plan:    Pap smear collected POCT UA as  patient recently took Azo Patient will be contacted with abnormal results Follow up with PCP in March as scheduled for diabetes management. Patient reports weight loss and drop in A1c from 10 to 8 See After Visit Summary for Counseling Recommendations

## 2021-09-10 NOTE — Progress Notes (Signed)
Pt felt she had uti and yeast approx 2 weeks ago, states better since taking AZO.  Pt would like urine check today.

## 2021-09-13 LAB — CYTOLOGY - PAP
Comment: NEGATIVE
Diagnosis: UNDETERMINED — AB
High risk HPV: NEGATIVE

## 2021-09-27 ENCOUNTER — Other Ambulatory Visit: Payer: Self-pay

## 2021-10-25 ENCOUNTER — Encounter: Payer: BC Managed Care – PPO | Admitting: Registered Nurse

## 2021-10-29 ENCOUNTER — Ambulatory Visit (INDEPENDENT_AMBULATORY_CARE_PROVIDER_SITE_OTHER): Payer: BC Managed Care – PPO | Admitting: Registered Nurse

## 2021-10-29 ENCOUNTER — Other Ambulatory Visit: Payer: Self-pay

## 2021-10-29 ENCOUNTER — Encounter: Payer: Self-pay | Admitting: Registered Nurse

## 2021-10-29 VITALS — BP 106/77 | HR 85 | Temp 97.8°F | Resp 18 | Ht 60.0 in | Wt 194.6 lb

## 2021-10-29 DIAGNOSIS — Z0000 Encounter for general adult medical examination without abnormal findings: Secondary | ICD-10-CM | POA: Diagnosis not present

## 2021-10-29 DIAGNOSIS — E119 Type 2 diabetes mellitus without complications: Secondary | ICD-10-CM

## 2021-10-29 DIAGNOSIS — I10 Essential (primary) hypertension: Secondary | ICD-10-CM | POA: Diagnosis not present

## 2021-10-29 LAB — LIPID PANEL
Cholesterol: 216 mg/dL — ABNORMAL HIGH (ref 0–200)
HDL: 60.3 mg/dL (ref >39.00)
LDL Cholesterol: 124 mg/dL — ABNORMAL HIGH (ref 0–99)
NonHDL: 155.65
Total CHOL/HDL Ratio: 4
Triglycerides: 156 mg/dL — ABNORMAL HIGH (ref 0.0–149.0)
VLDL: 31.2 mg/dL (ref 0.0–40.0)

## 2021-10-29 LAB — COMPREHENSIVE METABOLIC PANEL
ALT: 70 U/L — ABNORMAL HIGH (ref 0–35)
AST: 51 U/L — ABNORMAL HIGH (ref 0–37)
Albumin: 4.4 g/dL (ref 3.5–5.2)
Alkaline Phosphatase: 90 U/L (ref 39–117)
BUN: 8 mg/dL (ref 6–23)
CO2: 25 mEq/L (ref 19–32)
Calcium: 9.8 mg/dL (ref 8.4–10.5)
Chloride: 98 mEq/L (ref 96–112)
Creatinine, Ser: 0.47 mg/dL (ref 0.40–1.20)
GFR: 126.91 mL/min (ref >60.00)
Glucose, Bld: 234 mg/dL — ABNORMAL HIGH (ref 70–99)
Potassium: 4.5 mEq/L (ref 3.5–5.1)
Sodium: 135 mEq/L (ref 135–145)
Total Bilirubin: 0.5 mg/dL (ref 0.2–1.2)
Total Protein: 7.6 g/dL (ref 6.0–8.3)

## 2021-10-29 LAB — POCT GLYCOSYLATED HEMOGLOBIN (HGB A1C): Hemoglobin A1C: 9.9 % — AB (ref 4.0–5.6)

## 2021-10-29 LAB — CBC WITH DIFFERENTIAL/PLATELET
Basophils Absolute: 0 10*3/uL (ref 0.0–0.1)
Basophils Relative: 0.3 % (ref 0.0–3.0)
Eosinophils Absolute: 0.4 10*3/uL (ref 0.0–0.7)
Eosinophils Relative: 3.3 % (ref 0.0–5.0)
HCT: 37.5 % (ref 36.0–46.0)
Hemoglobin: 11.9 g/dL — ABNORMAL LOW (ref 12.0–15.0)
Lymphocytes Relative: 32.3 % (ref 12.0–46.0)
Lymphs Abs: 3.5 10*3/uL (ref 0.7–4.0)
MCHC: 31.9 g/dL (ref 30.0–36.0)
MCV: 73.6 fl — ABNORMAL LOW (ref 78.0–100.0)
Monocytes Absolute: 0.4 10*3/uL (ref 0.1–1.0)
Monocytes Relative: 3.8 % (ref 3.0–12.0)
Neutro Abs: 6.5 10*3/uL (ref 1.4–7.7)
Neutrophils Relative %: 60.3 % (ref 43.0–77.0)
Platelets: 276 10*3/uL (ref 150.0–400.0)
RBC: 5.09 Mil/uL (ref 3.87–5.11)
RDW: 17.6 % — ABNORMAL HIGH (ref 11.5–15.5)
WBC: 10.8 10*3/uL — ABNORMAL HIGH (ref 4.0–10.5)

## 2021-10-29 LAB — TSH: TSH: 3.11 u[IU]/mL (ref 0.35–5.50)

## 2021-10-29 MED ORDER — DULAGLUTIDE 1.5 MG/0.5ML ~~LOC~~ SOAJ: 1.5000 mg | SUBCUTANEOUS | 0 refills | Status: DC

## 2021-10-29 MED ORDER — DULAGLUTIDE 0.75 MG/0.5ML ~~LOC~~ SOAJ: 0.7500 mg | SUBCUTANEOUS | 0 refills | Status: DC

## 2021-10-29 MED ORDER — ENALAPRIL MALEATE 2.5 MG PO TABS: 2.5000 mg | ORAL_TABLET | ORAL | 0 refills | Status: DC

## 2021-10-29 NOTE — Patient Instructions (Addendum)
Ms. Gramajo ? ?Great to see you ? ?Normal exam ? ?Sugars high - start dulaglutide 0.75 weekly, then increase to 1.5 after 1 mo. ? ?See you in 3 mo to recheck ? ?Thank you ? ?Rich  ? ? ? ?If you have lab work done today you will be contacted with your lab results within the next 2 weeks.  If you have not heard from [place] then please contact us. The fastest way to get your results is to register for My Chart. ? ? ?IF you received an x-ray today, you will receive an invoice from [facility]. Please contact [facility] at [phone] with questions or concerns regarding your invoice.  ? ?IF you received labwork today, you will receive an invoice from [facility]. Please contact [facility] at [phone] with questions or concerns regarding your invoice.  ? ?Our billing staff will not be able to assist you with questions regarding bills from these companies. ? ?You will be contacted with the lab results as soon as they are available. The fastest way to get your results is to activate your My Chart account. Instructions are located on the last page of this paperwork. If you have not heard from [place] regarding the results in 2 weeks, please contact this office. ?  ? ? ?

## 2021-10-29 NOTE — Assessment & Plan Note (Signed)
Uncontrolled. Start dulaglutide 0.75mg  subq weekly. Increase to 1.5mg  subq weekly after 1 mo if tolerating well. recheck in 3 mo ?Reviewed risks, benefits, and side effects, pt voices understanding. ? ?

## 2021-10-29 NOTE — Addendum Note (Signed)
Addended by: Veneda Melter on: 10/29/2021 09:51 AM ? ? Modules accepted: Orders ? ?

## 2021-10-29 NOTE — Progress Notes (Signed)
? ?Complete physical exam ? ?Patient: Gabrielle Edwards   DOB: 10-Dec-1990   31 y.o. Female  MRN: [id number] ?Visit Date: 10/29/2021 ? ?Subjective:  ?  ?Chief Complaint  ?Patient presents with  ? Annual Exam  ?  Patient states she is here for CPE. Patient would like to discuss Heart Rate.  ? ? ?Gabrielle Edwards is a 31 y.o. female who presents today for a complete physical exam. She reports consuming a general diet.     She generally feels well. She reports sleeping well. She does have additional problems to discuss today.  ? ?Vision:Within the last year ?Dental:Within Last 6 months ?STD Screen:No ? ?Notes high heart rate at times.  ?Up to 105 max. ?Sometimes at rest ?No chest pain, palpitations, loc, lightheadedness, shob.  ? ?T2dm ?Last A1c:  ?Lab Results  ?Component Value Date  ? HGBA1C 9.9 (A) 10/29/2021  ?  ?Currently taking: glipizide-metformin 2.5-500mg po bid ?No new complications ?Reports good compliance with medications ?Diet has been steady ?Exercise habits have been steady ? ?Notes she has been on megace to control menstrual bleeding. Waking with sugars around 200.  ? ?Most recent fall risk assessment: ? ?  10/29/2021  ?  8:38 AM  ?Fall Risk   ?Falls in the past year? 0  ?Number falls in past yr: 0  ?Injury with Fall? 0  ?Risk for fall due to : No Fall Risks  ?Follow up Falls evaluation completed  ? ?  ?Most recent depression screenings: ? ?  11/22/2020  ?  4:13 PM 10/24/2020  ?  1:10 PM  ?PHQ 2/9 Scores  ?PHQ - 2 Score 1 0  ?PHQ- 9 Score 8   ? ? ? ? ?Patient Care Team: ?Maximiano Coss, NP as PCP - General (Adult Health Nurse Practitioner)  ? ?Medications: ?Outpatient Medications Prior to Visit  ?Medication Sig  ? acetaminophen (TYLENOL) 500 MG tablet Take 1,000 mg by mouth every 6 (six) hours as needed for mild pain or headache.  ? Blood Glucose Monitoring Suppl (ACCU-CHEK AVIVA PLUS) w/Device KIT 1 Device by Does not apply route as directed.  ? enalapril (VASOTEC) 2.5 MG tablet Take 1 tablet (2.5 mg total) by  mouth every other day.  ? ferrous sulfate 324 MG TBEC Take 324 mg by mouth every other day.  ? glipiZIDE-metformin (METAGLIP) 2.5-500 MG tablet Take 1 tablet by mouth 2 (two) times daily before a meal.  ? glucose blood (RELION PREMIER TEST) test strip Use as instructed  ? ibuprofen (ADVIL,MOTRIN) 800 MG tablet Take 1 tablet (800 mg total) by mouth every 8 (eight) hours as needed.  ? pravastatin (PRAVACHOL) 40 MG tablet TAKE 1 TABLET(40 MG) BY MOUTH DAILY  ? ?No facility-administered medications prior to visit.  ? ? ?Review of Systems  ?Constitutional: Negative.   ?HENT: Negative.    ?Eyes: Negative.   ?Respiratory: Negative.    ?Cardiovascular: Negative.   ?Gastrointestinal: Negative.   ?Genitourinary: Negative.   ?Musculoskeletal: Negative.   ?Skin: Negative.   ?Neurological: Negative.   ?Psychiatric/Behavioral: Negative.    ?All other systems reviewed and are negative. ? ? ?   ?Objective:  ? ?  ?BP 106/77   Pulse 85   Temp 97.8 ?F (36.6 ?C) (Temporal)   Resp 18   Ht 5' (1.524 m)   Wt 194 lb 9.6 oz (88.3 kg)   SpO2 99%   BMI 38.01 kg/m?  ? ? ? ?Physical Exam ?Vitals and nursing note reviewed.  ?Constitutional:   ?  General: She is not in acute distress. ?   Appearance: Normal appearance. She is normal weight. She is not ill-appearing, toxic-appearing or diaphoretic.  ?HENT:  ?   Head: Normocephalic and atraumatic.  ?   Right Ear: Tympanic membrane, ear canal and external ear normal. There is no impacted cerumen.  ?   Left Ear: Tympanic membrane, ear canal and external ear normal. There is no impacted cerumen.  ?   Nose: Nose normal. No congestion or rhinorrhea.  ?   Mouth/Throat:  ?   Mouth: Mucous membranes are moist.  ?   Pharynx: Oropharynx is clear. No oropharyngeal exudate or posterior oropharyngeal erythema.  ?Eyes:  ?   General: No scleral icterus.    ?   Right eye: No discharge.     ?   Left eye: No discharge.  ?   Extraocular Movements: Extraocular movements intact.  ?   Conjunctiva/sclera:  Conjunctivae normal.  ?   Pupils: Pupils are equal, round, and reactive to light.  ?Neck:  ?   Vascular: No carotid bruit.  ?Cardiovascular:  ?   Rate and Rhythm: Normal rate and regular rhythm.  ?   Pulses: Normal pulses.  ?   Heart sounds: Normal heart sounds. No murmur heard. ?  No friction rub. No gallop.  ?Pulmonary:  ?   Effort: Pulmonary effort is normal. No respiratory distress.  ?   Breath sounds: Normal breath sounds. No stridor. No wheezing, rhonchi or rales.  ?Chest:  ?   Chest wall: No tenderness.  ?Abdominal:  ?   General: Abdomen is flat. Bowel sounds are normal. There is no distension.  ?   Palpations: There is no mass.  ?   Tenderness: There is no abdominal tenderness. There is no right CVA tenderness, left CVA tenderness, guarding or rebound.  ?   Hernia: No hernia is present.  ?Musculoskeletal:     ?   General: No swelling, tenderness, deformity or signs of injury. Normal range of motion.  ?   Cervical back: Normal range of motion and neck supple. No rigidity or tenderness.  ?   Right lower leg: No edema.  ?   Left lower leg: No edema.  ?Lymphadenopathy:  ?   Cervical: No cervical adenopathy.  ?Skin: ?   General: Skin is warm and dry.  ?   Capillary Refill: Capillary refill takes less than 2 seconds.  ?   Coloration: Skin is not jaundiced or pale.  ?   Findings: No bruising, erythema, lesion or rash.  ?Neurological:  ?   General: No focal deficit present.  ?   Mental Status: She is alert and oriented to person, place, and time. Mental status is at baseline.  ?   Cranial Nerves: No cranial nerve deficit.  ?   Sensory: No sensory deficit.  ?   Motor: No weakness.  ?   Coordination: Coordination normal.  ?   Gait: Gait normal.  ?   Deep Tendon Reflexes: Reflexes normal.  ?Psychiatric:     ?   Mood and Affect: Mood normal.     ?   Behavior: Behavior normal.     ?   Thought Content: Thought content normal.     ?   Judgment: Judgment normal.  ?  ? ?Results for orders placed or performed in visit on  10/29/21  ?POCT glycosylated hemoglobin (Hb A1C)  ?Result Value Ref Range  ? Hemoglobin A1C 9.9 (A) 4.0 - 5.6 %  ? HbA1c POC (<>  result, manual entry)    ? HbA1c, POC (prediabetic range)    ? HbA1c, POC (controlled diabetic range)    ? ?   ?Assessment & Plan:  ?  ?Routine Health Maintenance and Physical Exam ? ?Immunization History  ?Administered Date(s) Administered  ? Influenza,inj,Quad PF,6+ Mos 07/19/2014  ? Influenza-Unspecified 11/09/2015, 05/15/2020  ? Tdap 11/09/2015  ? ? ?Health Maintenance  ?Topic Date Due  ? COVID-19 Vaccine (1) Never done  ? Hepatitis C Screening  Never done  ? OPHTHALMOLOGY EXAM  10/29/2021 (Originally 06/22/2000)  ? FOOT EXAM  11/13/2021 (Originally 06/21/2021)  ? INFLUENZA VACCINE  02/26/2022  ? HEMOGLOBIN A1C  04/30/2022  ? PAP SMEAR-Modifier  09/10/2024  ? TETANUS/TDAP  11/08/2025  ? HIV Screening  Completed  ? HPV VACCINES  Aged Out  ? ? ?Discussed health benefits of physical activity, and encouraged her to engage in regular exercise appropriate for her age and condition. ? ?Problem List Items Addressed This Visit   ? ?  ? Endocrine  ? Type 2 diabetes mellitus without complication, without long-term current use of insulin ([facility])  ?  Uncontrolled. Start dulaglutide 0.64m subq weekly. Increase to 1.516msubq weekly after 1 mo if tolerating well. recheck in 3 mo ?Reviewed risks, benefits, and side effects, pt voices understanding. ? ?  ?  ? Relevant Medications  ? Dulaglutide 0.75 MG/0.5ML SOPN  ? Dulaglutide 1.5 MG/0.5ML SOPN  ? Other Relevant Orders  ? POCT glycosylated hemoglobin (Hb A1C) (Completed)  ? CBC with Differential/Platelet  ? Comprehensive metabolic panel  ? Lipid panel  ? TSH  ? Urinalysis, Routine w reflex microscopic  ? Urine cytology ancillary only([place])  ? ?Other Visit Diagnoses   ? ? Annual physical exam    -  Primary  ? Relevant Orders  ? CBC with Differential/Platelet  ? Comprehensive metabolic panel  ? Lipid panel  ? TSH  ? ?  ? ?Return in about 3 months  (around 01/28/2022) for Chronic Conditions. ? ?  ? ?PLAN ?See problem based charting ?Labs collected. Will follow up with the patient as warranted. ?Exam unremarkable ?Patient encouraged to call clinic with any questions

## 2021-10-29 NOTE — Addendum Note (Signed)
Addended by: Rudi Heap on: 10/29/2021 09:23 AM ? ? Modules accepted: Orders ? ?

## 2021-11-19 ENCOUNTER — Telehealth: Payer: Self-pay

## 2021-11-19 NOTE — Telephone Encounter (Addendum)
Patient called in stating that she has had side effects from taking the trulicity. First day was Diarrhea, burping then has each day progressed she was having nausea, and extreme fatigue - states she slept over half the day which is not like her. Asking to switch medications  ?

## 2021-11-19 NOTE — Telephone Encounter (Signed)
MEDICATION: pravastatin (PRAVACHOL) 40 MG tablet ? ?PHARMACY: [id number] - [place], [place] - [address] ? ?Comments:  ? ?**Let patient know to contact pharmacy at the end of the day to make sure medication is ready. ** ? ?** Please notify patient to allow 48-72 hours to process** ? ?**Encourage patient to contact the pharmacy for refills or they can request refills through [facility]** ? ? ?

## 2021-11-20 ENCOUNTER — Other Ambulatory Visit: Payer: Self-pay

## 2021-11-20 ENCOUNTER — Other Ambulatory Visit: Payer: Self-pay | Admitting: Registered Nurse

## 2021-11-20 DIAGNOSIS — E1169 Type 2 diabetes mellitus with other specified complication: Secondary | ICD-10-CM

## 2021-11-20 DIAGNOSIS — E119 Type 2 diabetes mellitus without complications: Secondary | ICD-10-CM

## 2021-11-20 MED ORDER — PRAVASTATIN SODIUM 40 MG PO TABS: 40.0000 mg | ORAL_TABLET | Freq: Every day | ORAL | 1 refills | Status: DC

## 2021-11-20 MED ORDER — BYDUREON BCISE 2 MG/0.85ML ~~LOC~~ AUIJ: 2.0000 mg | AUTO-INJECTOR | SUBCUTANEOUS | 2 refills | Status: DC

## 2021-11-20 NOTE — Progress Notes (Unsigned)
Refill sent.

## 2021-11-20 NOTE — Telephone Encounter (Signed)
Pt informed and voiced understanding will monitor sxs on new med and report back if further issues  ?

## 2021-11-20 NOTE — Telephone Encounter (Signed)
Will send exenatide which has a preferable GI side effect profile. Stop trulicity, start this once weekly. Monitor effect. No dose changes until I see her again and we can reassess her sugars. ? ?Thanks, ? ?Rich

## 2021-11-20 NOTE — Telephone Encounter (Signed)
Pravastatin sent in as refill 6 months.  ? ?See previous note, pt is not feeling well taking trulicity and is requesting alternative, please advise  ?

## 2021-11-21 NOTE — Telephone Encounter (Signed)
Pt asking for another alternative.  ? ?

## 2021-11-21 NOTE — Telephone Encounter (Signed)
Patient called in stating that Exenatide is too expensive and would like to know if there is cheaper options.  ?

## 2021-11-22 ENCOUNTER — Other Ambulatory Visit: Payer: Self-pay | Admitting: Registered Nurse

## 2021-11-22 DIAGNOSIS — E119 Type 2 diabetes mellitus without complications: Secondary | ICD-10-CM

## 2021-11-22 MED ORDER — INSULIN GLARGINE (2 UNIT DIAL) 300 UNIT/ML ~~LOC~~ SOPN: 14.0000 [IU] | PEN_INJECTOR | Freq: Every evening | SUBCUTANEOUS | 0 refills | Status: DC

## 2021-11-22 NOTE — Telephone Encounter (Signed)
Have sent Toujeo MAX 14 units long acting insulin to take before bed each evening. Monitor sugars closely. Follow up as planned in July.  ? ?Thanks, ? ?Rich

## 2021-11-23 NOTE — Telephone Encounter (Signed)
Called and LM letting her know this was sent and call with questions  ?

## 2021-12-08 ENCOUNTER — Other Ambulatory Visit: Payer: Self-pay | Admitting: Registered Nurse

## 2021-12-08 DIAGNOSIS — E119 Type 2 diabetes mellitus without complications: Secondary | ICD-10-CM

## 2022-01-01 ENCOUNTER — Ambulatory Visit: Payer: BC Managed Care – PPO | Admitting: Registered Nurse

## 2022-01-01 ENCOUNTER — Encounter: Payer: Self-pay | Admitting: Registered Nurse

## 2022-01-01 VITALS — BP 128/82 | HR 94 | Temp 98.1°F | Resp 16 | Ht 60.0 in | Wt 196.4 lb

## 2022-01-01 DIAGNOSIS — R1114 Bilious vomiting: Secondary | ICD-10-CM | POA: Diagnosis not present

## 2022-01-01 DIAGNOSIS — R1013 Epigastric pain: Secondary | ICD-10-CM

## 2022-01-01 DIAGNOSIS — E119 Type 2 diabetes mellitus without complications: Secondary | ICD-10-CM | POA: Diagnosis not present

## 2022-01-01 LAB — CBC WITH DIFFERENTIAL/PLATELET
Basophils Absolute: 0 10*3/uL (ref 0.0–0.1)
Basophils Relative: 0.4 % (ref 0.0–3.0)
Eosinophils Absolute: 0.4 10*3/uL (ref 0.0–0.7)
Eosinophils Relative: 4 % (ref 0.0–5.0)
HCT: 36.5 % (ref 36.0–46.0)
Hemoglobin: 11.6 g/dL — ABNORMAL LOW (ref 12.0–15.0)
Lymphocytes Relative: 29.9 % (ref 12.0–46.0)
Lymphs Abs: 2.8 10*3/uL (ref 0.7–4.0)
MCHC: 31.7 g/dL (ref 30.0–36.0)
MCV: 76.1 fl — ABNORMAL LOW (ref 78.0–100.0)
Monocytes Absolute: 0.4 10*3/uL (ref 0.1–1.0)
Monocytes Relative: 4.3 % (ref 3.0–12.0)
Neutro Abs: 5.8 10*3/uL (ref 1.4–7.7)
Neutrophils Relative %: 61.4 % (ref 43.0–77.0)
Platelets: 305 10*3/uL (ref 150.0–400.0)
RBC: 4.8 Mil/uL (ref 3.87–5.11)
RDW: 15.2 % (ref 11.5–15.5)
WBC: 9.5 10*3/uL (ref 4.0–10.5)

## 2022-01-01 LAB — COMPREHENSIVE METABOLIC PANEL
ALT: 58 U/L — ABNORMAL HIGH (ref 0–35)
AST: 36 U/L (ref 0–37)
Albumin: 4.1 g/dL (ref 3.5–5.2)
Alkaline Phosphatase: 95 U/L (ref 39–117)
BUN: 8 mg/dL (ref 6–23)
CO2: 25 mEq/L (ref 19–32)
Calcium: 9 mg/dL (ref 8.4–10.5)
Chloride: 102 mEq/L (ref 96–112)
Creatinine, Ser: 0.45 mg/dL (ref 0.40–1.20)
GFR: 128.09 mL/min (ref >60.00)
Glucose, Bld: 235 mg/dL — ABNORMAL HIGH (ref 70–99)
Potassium: 4 mEq/L (ref 3.5–5.1)
Sodium: 136 mEq/L (ref 135–145)
Total Bilirubin: 0.3 mg/dL (ref 0.2–1.2)
Total Protein: 7.6 g/dL (ref 6.0–8.3)

## 2022-01-01 LAB — LIPASE: Lipase: 20 U/L (ref 11.0–59.0)

## 2022-01-01 LAB — AMYLASE: Amylase: 19 U/L — ABNORMAL LOW (ref 27–131)

## 2022-01-01 MED ORDER — ONDANSETRON 4 MG PO TBDP: 4.0000 mg | ORAL_TABLET | Freq: Three times a day (TID) | ORAL | 0 refills | Status: DC | PRN

## 2022-01-01 MED ORDER — INSULIN GLARGINE-YFGN 100 UNIT/ML ~~LOC~~ SOPN: 14.0000 [IU] | PEN_INJECTOR | Freq: Every day | SUBCUTANEOUS | 0 refills | Status: DC

## 2022-01-01 MED ORDER — PANTOPRAZOLE SODIUM 40 MG PO TBEC: 40.0000 mg | DELAYED_RELEASE_TABLET | Freq: Every day | ORAL | 3 refills | Status: DC

## 2022-01-01 NOTE — Progress Notes (Signed)
Acute Office Visit  Subjective:    Patient ID: Gabrielle Edwards, female    DOB: Apr 05, 1991, 31 y.o.   MRN: [id number]  Chief Complaint  Patient presents with   Abdominal Pain    Pt states that since 12/28/21 she has been having upper abdominal pain mid section. The pain comes and goes. The pain is worsening.     HPI Patient is in today for upper abdominal pain  Onset 4 days ago. Describes as twisting, knotting pain Had taken tylenol with some relief.   Pain does cause some nausea, one episode vomiting Saturday, once Sunday.. No food, mostly bile.  Some diarrhea going on.   Not feeling well generally. Poor appetite.   Sugar check - 165 or so over weekend.    Diet recall:  Popcorn, watermelon.  Water Crackers, chicken broth   Outpatient Medications Prior to Visit  Medication Sig Dispense Refill   acetaminophen (TYLENOL) 500 MG tablet Take 1,000 mg by mouth every 6 (six) hours as needed for mild pain or headache.     Blood Glucose Monitoring Suppl (ACCU-CHEK AVIVA PLUS) w/Device KIT 1 Device by Does not apply route as directed. 1 kit 0   enalapril (VASOTEC) 2.5 MG tablet Take 1 tablet (2.5 mg total) by mouth every other day. 90 tablet 0   ferrous sulfate 324 MG TBEC Take 324 mg by mouth every other day.     glucose blood (RELION PREMIER TEST) test strip Use as instructed 100 each 12   ibuprofen (ADVIL,MOTRIN) 800 MG tablet Take 1 tablet (800 mg total) by mouth every 8 (eight) hours as needed. 30 tablet 0   pravastatin (PRAVACHOL) 40 MG tablet Take 1 tablet (40 mg total) by mouth daily. 90 tablet 1   insulin glargine, 2 Unit Dial, (TOUJEO MAX) 300 UNIT/ML Solostar Pen Inject 14 Units into the skin at bedtime. 6 mL 0   TRULICITY 5.39 JQ/7.3AL SOPN INJECT 0.75 MG SUBCUTANEOUSLY ONE TIME PER WEEK 6 mL 0   glipiZIDE-metformin (METAGLIP) 2.5-500 MG tablet Take 1 tablet by mouth 2 (two) times daily before a meal. 180 tablet 1   No facility-administered medications prior to visit.     Review of Systems  Constitutional: Negative.   HENT: Negative.    Eyes: Negative.   Respiratory: Negative.    Cardiovascular: Negative.   Gastrointestinal:  Positive for abdominal pain, diarrhea and vomiting. Negative for abdominal distention, anal bleeding, blood in stool, constipation, nausea and rectal pain.  Genitourinary: Negative.   Musculoskeletal: Negative.   Skin: Negative.   Neurological: Negative.   Psychiatric/Behavioral: Negative.    All other systems reviewed and are negative.     Objective:    BP 128/82   Pulse 94   Temp 98.1 F (36.7 C) (Temporal)   Resp 16   Ht 5' (1.524 m)   Wt 196 lb 6.4 oz (89.1 kg)   SpO2 100%   BMI 38.36 kg/m  Physical Exam Vitals and nursing note reviewed.  Constitutional:      General: She is not in acute distress.    Appearance: Normal appearance. She is normal weight. She is not ill-appearing, toxic-appearing or diaphoretic.  Cardiovascular:     Rate and Rhythm: Normal rate and regular rhythm.     Heart sounds: Normal heart sounds. No murmur heard.   No friction rub. No gallop.  Pulmonary:     Effort: Pulmonary effort is normal. No respiratory distress.     Breath sounds: Normal breath sounds. No  stridor. No wheezing, rhonchi or rales.  Chest:     Chest wall: No tenderness.  Abdominal:     General: Bowel sounds are normal.     Tenderness: There is no abdominal tenderness. There is no right CVA tenderness or left CVA tenderness.  Skin:    General: Skin is warm and dry.  Neurological:     General: No focal deficit present.     Mental Status: She is alert and oriented to person, place, and time. Mental status is at baseline.  Psychiatric:        Mood and Affect: Mood normal.        Behavior: Behavior normal.        Thought Content: Thought content normal.        Judgment: Judgment normal.    No results found for any visits on 01/01/22.      Assessment & Plan:  1. Type 2 diabetes mellitus without complication,  without long-term current use of insulin (HCC) - insulin glargine-yfgn (SEMGLEE, YFGN,) 100 UNIT/ML Pen; Inject 14 Units into the skin daily.  Dispense: 15 mL; Refill: 0  2. Epigastric abdominal pain - pantoprazole (PROTONIX) 40 MG tablet; Take 1 tablet (40 mg total) by mouth daily.  Dispense: 30 tablet; Refill: 3 - CBC with Differential/Platelet - Comprehensive metabolic panel - US Abdomen Limited RUQ (LIVER/GB); Future - Amylase - Lipase  3. Bilious vomiting with nausea - ondansetron (ZOFRAN-ODT) 4 MG disintegrating tablet; Take 1 tablet (4 mg total) by mouth every 8 (eight) hours as needed for nausea or vomiting.  Dispense: 10 tablet; Refill: 0 - CBC with Differential/Platelet - Comprehensive metabolic panel - US Abdomen Limited RUQ (LIVER/GB); Future    Meds ordered this encounter  Medications   insulin glargine-yfgn (SEMGLEE, YFGN,) 100 UNIT/ML Pen    Sig: Inject 14 Units into the skin daily.    Dispense:  15 mL    Refill:  0    Order Specific Question:   Supervising Provider    Answer:   Carlota Raspberry, JEFFREY R [[id number]]   pantoprazole (PROTONIX) 40 MG tablet    Sig: Take 1 tablet (40 mg total) by mouth daily.    Dispense:  30 tablet    Refill:  3    Order Specific Question:   Supervising Provider    Answer:   Carlota Raspberry, JEFFREY R [[id number]]   ondansetron (ZOFRAN-ODT) 4 MG disintegrating tablet    Sig: Take 1 tablet (4 mg total) by mouth every 8 (eight) hours as needed for nausea or vomiting.    Dispense:  10 tablet    Refill:  0    Order Specific Question:   Supervising Provider    Answer:   Carlota Raspberry, JEFFREY R [[id number]]    Return if symptoms worsen or fail to improve.  PLAN Pantoprazole and zofran for symptom Will obtain US ruq to rule out gallstone Feel there may be an element of poor gastric motility secondary to t2dm, low chance for pancreatitis due to trulicity as she only took one dose, but will check pancreatic enzymes.  Labs collected. Will follow up with the patient as  warranted. Patient encouraged to call clinic with any questions, comments, or concerns.   Maximiano Coss, NP

## 2022-01-01 NOTE — Patient Instructions (Signed)
Ms. Georgenia Salim to see you  Call with concerns  I will let you know how labs look  I will have a scheduler reach out to get the US done  Thanks,  Rich

## 2022-01-09 ENCOUNTER — Ambulatory Visit
Admission: RE | Admit: 2022-01-09 | Discharge: 2022-01-09 | Disposition: A | Discharge status: Home / Self Care | Payer: BC Managed Care – PPO | Source: Ambulatory Visit | Attending: Registered Nurse | Admitting: Registered Nurse

## 2022-01-09 DIAGNOSIS — R1013 Epigastric pain: Secondary | ICD-10-CM

## 2022-01-09 DIAGNOSIS — R1114 Bilious vomiting: Secondary | ICD-10-CM

## 2022-01-17 ENCOUNTER — Telehealth: Payer: Self-pay

## 2022-01-17 ENCOUNTER — Telehealth: Payer: Self-pay | Admitting: Registered Nurse

## 2022-01-17 ENCOUNTER — Other Ambulatory Visit: Payer: Self-pay

## 2022-01-17 DIAGNOSIS — I10 Essential (primary) hypertension: Secondary | ICD-10-CM

## 2022-01-17 MED ORDER — ENALAPRIL MALEATE 2.5 MG PO TABS: 2.5000 mg | ORAL_TABLET | ORAL | 0 refills | Status: DC

## 2022-01-17 NOTE — Telephone Encounter (Signed)
Scheduled for video visit.

## 2022-01-17 NOTE — Telephone Encounter (Signed)
PT called stating she need a refill on enalapril 2.5 mg. Pt is requesting keflex 500 mg for travel.

## 2022-01-18 ENCOUNTER — Encounter: Payer: Self-pay | Admitting: Family Medicine

## 2022-01-18 ENCOUNTER — Telehealth (INDEPENDENT_AMBULATORY_CARE_PROVIDER_SITE_OTHER): Payer: BC Managed Care – PPO | Admitting: Family Medicine

## 2022-01-18 DIAGNOSIS — Z7184 Encounter for health counseling related to travel: Secondary | ICD-10-CM | POA: Diagnosis not present

## 2022-01-18 MED ORDER — CEPHALEXIN 500 MG PO CAPS
500.0000 mg | ORAL_CAPSULE | Freq: Two times a day (BID) | ORAL | 0 refills | Status: DC
Start: 2022-01-18 — End: 2022-08-08

## 2022-01-18 MED ORDER — FLUCONAZOLE 150 MG PO TABS: 150.0000 mg | ORAL_TABLET | Freq: Once | ORAL | 0 refills | Status: AC

## 2022-01-18 MED ORDER — ATOVAQUONE-PROGUANIL HCL 250-100 MG PO TABS: 1.0000 | ORAL_TABLET | Freq: Every day | ORAL | 0 refills | Status: DC

## 2022-01-21 ENCOUNTER — Other Ambulatory Visit: Payer: Self-pay | Admitting: Registered Nurse

## 2022-01-21 ENCOUNTER — Telehealth: Payer: Self-pay | Admitting: Registered Nurse

## 2022-01-21 DIAGNOSIS — K7689 Other specified diseases of liver: Secondary | ICD-10-CM

## 2022-01-21 NOTE — Telephone Encounter (Signed)
error 

## 2022-01-21 NOTE — Telephone Encounter (Signed)
Orders are in and they will contact pt with an apt

## 2022-01-27 ENCOUNTER — Ambulatory Visit
Admission: RE | Admit: 2022-01-27 | Discharge: 2022-01-27 | Disposition: A | Discharge status: Home / Self Care | Payer: BC Managed Care – PPO | Source: Ambulatory Visit | Attending: Registered Nurse | Admitting: Registered Nurse

## 2022-01-27 DIAGNOSIS — K7689 Other specified diseases of liver: Secondary | ICD-10-CM

## 2022-01-27 DIAGNOSIS — K760 Fatty (change of) liver, not elsewhere classified: Secondary | ICD-10-CM | POA: Diagnosis not present

## 2022-01-27 DIAGNOSIS — D1803 Hemangioma of intra-abdominal structures: Secondary | ICD-10-CM | POA: Diagnosis not present

## 2022-01-27 MED ORDER — GADOBENATE DIMEGLUMINE 529 MG/ML IV SOLN
18.0000 mL | Freq: Once | INTRAVENOUS | Status: AC | PRN
  Administered 2022-01-27: 18 mL via INTRAVENOUS

## 2022-01-28 ENCOUNTER — Telehealth: Payer: Self-pay | Admitting: Registered Nurse

## 2022-01-28 NOTE — Telephone Encounter (Signed)
Ray from [facility] called with a report for pt on MRI with Dr. Amil Amen.  [phone]

## 2022-01-28 NOTE — Telephone Encounter (Signed)
Radiology called to make you aware of impression: Indeterminate enhancing lesions within the liver parenchyma. Asked that I high priority to pcp

## 2022-01-29 ENCOUNTER — Other Ambulatory Visit: Payer: Self-pay | Admitting: Registered Nurse

## 2022-01-29 DIAGNOSIS — K7689 Other specified diseases of liver: Secondary | ICD-10-CM

## 2022-01-29 NOTE — Telephone Encounter (Signed)
Noted - have put in follow up orders and let patient know via myChart  Thanks,  Rich

## 2022-02-25 ENCOUNTER — Ambulatory Visit: Payer: BC Managed Care – PPO | Admitting: Registered Nurse

## 2022-04-18 ENCOUNTER — Ambulatory Visit: Payer: BC Managed Care – PPO | Admitting: Family Medicine

## 2022-04-18 ENCOUNTER — Encounter: Payer: Self-pay | Admitting: Family Medicine

## 2022-04-18 VITALS — BP 128/70 | HR 94 | Temp 97.5°F | Resp 17 | Wt 192.0 lb

## 2022-04-18 DIAGNOSIS — I10 Essential (primary) hypertension: Secondary | ICD-10-CM

## 2022-04-18 DIAGNOSIS — E119 Type 2 diabetes mellitus without complications: Secondary | ICD-10-CM | POA: Diagnosis not present

## 2022-04-18 DIAGNOSIS — R590 Localized enlarged lymph nodes: Secondary | ICD-10-CM | POA: Diagnosis not present

## 2022-04-18 MED ORDER — GLIPIZIDE-METFORMIN HCL 2.5-500 MG PO TABS: 1.0000 | ORAL_TABLET | Freq: Two times a day (BID) | ORAL | 0 refills | Status: DC

## 2022-04-18 MED ORDER — ENALAPRIL MALEATE 2.5 MG PO TABS: 2.5000 mg | ORAL_TABLET | ORAL | 0 refills | Status: DC

## 2022-04-18 NOTE — Progress Notes (Signed)
   Subjective:    Patient ID: Gabrielle Edwards, female    DOB: Dec 13, 1990, 31 y.o.   MRN: [id number]  HPI Lump under chin- pt noticed area yesterday morning.  Denies pain or trouble swallowing.  Has been using warm compresses, taking ibuprofen.  + body aches- started this morning.  Denies fevers.  No change in appetite.  Has small sore on tip of tongue.  No cough.   Review of Systems For ROS see HPI     Objective:   Physical Exam Vitals reviewed.  Constitutional:      General: She is not in acute distress.    Appearance: Normal appearance. She is not ill-appearing.  HENT:     Head: Normocephalic and atraumatic.     Mouth/Throat:     Mouth: Mucous membranes are moist.     Pharynx: No posterior oropharyngeal erythema.     Comments: Small ulcer on tip of tongue Eyes:     Extraocular Movements: Extraocular movements intact.     Conjunctiva/sclera: Conjunctivae normal.     Pupils: Pupils are equal, round, and reactive to light.  Musculoskeletal:     Cervical back: Normal range of motion and neck supple. No tenderness.  Lymphadenopathy:     Head:     Right side of head: Submental adenopathy present. No submandibular, preauricular or posterior auricular adenopathy.     Left side of head: No submandibular, preauricular or posterior auricular adenopathy.     Cervical: No cervical adenopathy.     Right cervical: No superficial, deep or posterior cervical adenopathy.    Left cervical: No superficial, deep or posterior cervical adenopathy.     Upper Body:     Right upper body: No supraclavicular adenopathy.     Left upper body: No supraclavicular adenopathy.  Neurological:     Mental Status: She is alert.           Assessment & Plan:   Submental LAD- new.  Suspect this is reactive as pt doesn't have pain.  Does have ulcer on tip of tongue and feels achy.  Will get CBC to assess blood counts but no other LAD noted on PE.  Encouraged pt to monitor x7-10 days and let me know if  things have improved, worsened, or remain the same.  If worsening or unchanged will get [place] to assess.  Pt expressed understanding and is in agreement w/ plan.

## 2022-04-18 NOTE — Patient Instructions (Signed)
Please follow up by phone or MyChart in 7-10 days and let me know if the lump is improving, enlarging, or staying the same [facility] notify you of your lab results and make any changes if needed Tylenol or ibuprofen as needed for discomfort Gargle w/ salt water to help the ulcer on your tongue If your body aches change or worsen or you develop any other symptoms, let us know Call with any questions or concerns Hang in there!!!

## 2022-04-19 LAB — CBC WITH DIFFERENTIAL/PLATELET
Basophils Absolute: 0.1 10*3/uL (ref 0.0–0.1)
Basophils Relative: 0.5 % (ref 0.0–3.0)
Eosinophils Absolute: 0.3 10*3/uL (ref 0.0–0.7)
Eosinophils Relative: 2.9 % (ref 0.0–5.0)
HCT: 37.4 % (ref 36.0–46.0)
Hemoglobin: 11.7 g/dL — ABNORMAL LOW (ref 12.0–15.0)
Lymphocytes Relative: 27.5 % (ref 12.0–46.0)
Lymphs Abs: 3.1 10*3/uL (ref 0.7–4.0)
MCHC: 31.3 g/dL (ref 30.0–36.0)
MCV: 70.7 fl — ABNORMAL LOW (ref 78.0–100.0)
Monocytes Absolute: 0.4 10*3/uL (ref 0.1–1.0)
Monocytes Relative: 4 % (ref 3.0–12.0)
Neutro Abs: 7.2 10*3/uL (ref 1.4–7.7)
Neutrophils Relative %: 65.1 % (ref 43.0–77.0)
Platelets: 288 10*3/uL (ref 150.0–400.0)
RBC: 5.3 Mil/uL — ABNORMAL HIGH (ref 3.87–5.11)
RDW: 17.1 % — ABNORMAL HIGH (ref 11.5–15.5)
WBC: 11.1 10*3/uL — ABNORMAL HIGH (ref 4.0–10.5)

## 2022-04-19 LAB — TSH: TSH: 1.15 u[IU]/mL (ref 0.35–5.50)

## 2022-04-19 NOTE — Progress Notes (Signed)
Pt seen results VIA my chart  

## 2022-04-24 ENCOUNTER — Encounter: Payer: Self-pay | Admitting: Registered Nurse

## 2022-04-25 ENCOUNTER — Telehealth: Payer: Self-pay | Admitting: Registered Nurse

## 2022-04-25 NOTE — Telephone Encounter (Signed)
I have LM for pt to call me back ELEA

## 2022-04-25 NOTE — Telephone Encounter (Signed)
Caller name: Darlean Warmoth   On DPR? :yes/no: Yes  Call back number: [phone]  Provider they see: morrow   Reason for call: Pt called stating that she was charged for a MRI with contrast and without contrast. Pt states that she called billing for [place]. [facility] told pt, she needs to contact whom ever put in the referral. Pt wants Danae Chen to contact her when ever she is done looking over the orders.

## 2022-04-25 NOTE — Telephone Encounter (Signed)
error 

## 2022-05-02 ENCOUNTER — Telehealth: Payer: Self-pay | Admitting: Family Medicine

## 2022-05-02 NOTE — Telephone Encounter (Signed)
Pt was very impressed with the level of care she received from you and wanted to know if you would take her on as a new pt?

## 2022-05-02 NOTE — Telephone Encounter (Signed)
Pt is now aware that Dr.Tabori will be her new pcp. Pt said Thank you so mush Dr.Tabori.

## 2022-05-02 NOTE — Telephone Encounter (Signed)
Ok to establish 

## 2022-05-08 ENCOUNTER — Encounter: Payer: Self-pay | Admitting: Gastroenterology

## 2022-05-09 ENCOUNTER — Encounter: Payer: Self-pay | Admitting: Gastroenterology

## 2022-05-16 ENCOUNTER — Other Ambulatory Visit: Payer: Self-pay | Admitting: Family Medicine

## 2022-05-16 DIAGNOSIS — I10 Essential (primary) hypertension: Secondary | ICD-10-CM

## 2022-05-24 ENCOUNTER — Other Ambulatory Visit: Payer: Self-pay

## 2022-05-24 ENCOUNTER — Telehealth: Payer: Self-pay | Admitting: Family Medicine

## 2022-05-24 DIAGNOSIS — R1013 Epigastric pain: Secondary | ICD-10-CM

## 2022-05-24 DIAGNOSIS — E1169 Type 2 diabetes mellitus with other specified complication: Secondary | ICD-10-CM

## 2022-05-24 MED ORDER — PANTOPRAZOLE SODIUM 40 MG PO TBEC: 40.0000 mg | DELAYED_RELEASE_TABLET | Freq: Every day | ORAL | 3 refills | Status: DC

## 2022-05-24 MED ORDER — PRAVASTATIN SODIUM 40 MG PO TABS: 40.0000 mg | ORAL_TABLET | Freq: Every day | ORAL | 1 refills | Status: DC

## 2022-05-24 NOTE — Telephone Encounter (Signed)
Refill sent.

## 2022-05-24 NOTE — Telephone Encounter (Signed)
Encourage patient to contact the pharmacy for refills or they can request refills through [facility]  (Please schedule appointment if patient has not been seen in over a year) 04/18/22   WHAT PHARMACY WOULD THEY LIKE THIS SENT TO: [id number] [place], [place] - [address] AT [place] NAME & DOSE: pantoprazole 40 mg and pravastatin 40 mg   NOTES/COMMENTS FROM PATIENT: Pt also want to know if she can get her A1C check and liver function . Pt is having oral surgery and they need her A1C level and liver function.       [place] office please notify patient: It takes 48-72 hours to process rx refill requests Ask patient to call pharmacy to ensure rx is ready before heading there.

## 2022-06-18 ENCOUNTER — Ambulatory Visit: Payer: BC Managed Care – PPO | Admitting: Gastroenterology

## 2022-07-24 ENCOUNTER — Telehealth: Payer: Self-pay | Admitting: Family Medicine

## 2022-07-24 NOTE — Telephone Encounter (Signed)
Initial Comment Caller state she tested positive for COVID, and needing medication sent it , experiencing cough,congestion and headache. Translation No Nurse Assessment Nurse: Daphine Deutscher, RN, Cala Bradford Date/Time Lamount Cohen Time): 07/20/2022 5:52:26 PM Confirm and document reason for call. If symptomatic, describe symptoms. ---caller states she tested positive for covid today, symptoms started yesterday. runny nose, congestion headache and cough. no fever. Does the patient have any new or worsening symptoms? ---Yes Will a triage be completed? ---Yes Related visit to physician within the last 2 weeks? ---No Does the PT have any chronic conditions? (i.e. diabetes, asthma, this includes High risk factors for pregnancy, etc.) ---Yes List chronic conditions. ---diabetes, liver disease Is the patient pregnant or possibly pregnant? (Ask all females between the ages of 84-55) ---No Is this a behavioral health or substance abuse call? ---No Guidelines Guideline Title Affirmed Question Affirmed Notes Nurse Date/Time (Eastern Time) COVID-19 - Diagnosed or Suspected Cough present > 3 weeks Abram Sander 07/20/2022 5:54:51 PM PLEASE NOTE: All timestamps contained within this report are represented as [place] Standard Time. CONFIDENTIALTY NOTICE: This fax transmission is intended only for the addressee. It contains information that is legally privileged, confidential or otherwise protected from use or disclosure. If you are not the intended recipient, you are strictly prohibited from reviewing, disclosing, copying using or disseminating any of this information or taking any action in reliance on or regarding this information. If you have received this fax in error, please notify us immediately by telephone so that we can arrange for its return to [place]. Phone: [phone], Toll-Free: [phone], Fax: [phone] Page: 2 of 2 Call Id: [id number] Disp. Time Lamount Cohen Time) Disposition Final  User 07/20/2022 6:01:49 PM See HCP within 4 Hours (or PCP triage) Yes Daphine Deutscher, RN, Cala Bradford Final Disposition 07/20/2022 6:01:49 PM See HCP within 4 Hours (or PCP triage) Yes Daphine Deutscher, RN, Cala Bradford Disposition Overriden: Call PCP when Office is Open Override Reason: Patient's symptoms need a higher level of care Caller Disagree/Comply Comply Caller Understands Yes PreDisposition Call Doctor Care Advice Given Per Guideline SEE HCP (OR PCP TRIAGE) WITHIN 4 HOURS: GENERAL CARE ADVICE FOR COVID-19 SYMPTOMS: * The symptoms are generally treated the same whether you have COVID-19, influenza or some other respiratory virus. * Cough: Use cough drops. * Feeling dehydrated: Drink extra liquids. If the air in your home is dry, use a humidifier. * Muscle aches, headache, and other pains: Often this comes and goes with the fever. Take acetaminophen every 4 to 6 hours (Adults 650 mg) OR ibuprofen every 6 to 8 hours (Adults 400 mg). Before taking any medicine, read all the instructions on the package. * Sore throat: Try throat lozenges, hard candy or warm chicken broth. CALL BACK IF: * You become worse  Spoke with patient this morning, patient states that she is feeling mush better. Patient don't need medication or appt

## 2022-08-07 ENCOUNTER — Encounter: Payer: Self-pay | Admitting: Gastroenterology

## 2022-08-07 ENCOUNTER — Other Ambulatory Visit (INDEPENDENT_AMBULATORY_CARE_PROVIDER_SITE_OTHER): Payer: BC Managed Care – PPO

## 2022-08-07 ENCOUNTER — Ambulatory Visit: Payer: BC Managed Care – PPO | Admitting: Gastroenterology

## 2022-08-07 VITALS — BP 124/90 | HR 105 | Ht 60.0 in | Wt 196.0 lb

## 2022-08-07 DIAGNOSIS — R932 Abnormal findings on diagnostic imaging of liver and biliary tract: Secondary | ICD-10-CM

## 2022-08-07 DIAGNOSIS — R7401 Elevation of levels of liver transaminase levels: Secondary | ICD-10-CM

## 2022-08-07 DIAGNOSIS — K760 Fatty (change of) liver, not elsewhere classified: Secondary | ICD-10-CM

## 2022-08-07 LAB — IBC + FERRITIN
Ferritin: 58.6 ng/mL (ref 10.0–291.0)
Iron: 44 ug/dL (ref 42–145)
Saturation Ratios: 9 % — ABNORMAL LOW (ref 20.0–50.0)
TIBC: 490 ug/dL — ABNORMAL HIGH (ref 250.0–450.0)
Transferrin: 350 mg/dL (ref 212.0–360.0)

## 2022-08-07 NOTE — Patient Instructions (Addendum)
_______________________________________________________  If you are age 33 or older, your body mass index should be between 23-30. Your Body mass index is 38.28 kg/m. If this is out of the aforementioned range listed, please consider follow up with your Primary Care Provider.  If you are age 66 or younger, your body mass index should be between 19-25. Your Body mass index is 38.28 kg/m. If this is out of the aformentioned range listed, please consider follow up with your Primary Care Provider.   Your provider has requested that you go to the basement level for lab work before leaving today. Press "B" on the elevator. The lab is located at the first door on the left as you exit the elevator.  You have been scheduled for an MRI at [facility] on 08/15/22. Your appointment time is 8am. Please arrive to admitting (at main entrance of the hospital) 30 minutes prior to your appointment time for registration purposes. Please make certain not to have anything to eat or drink 6 hours prior to your test. In addition, if you have any metal in your body, have a pacemaker or defibrillator, please be sure to let your ordering physician know. This test typically takes 45 minutes to 1 hour to complete. Should you need to reschedule, please call [phone] to do so.  You may take any medications as prescribed with a small amount of water, if necessary. If you take any of the following medications: METFORMIN, GLUCOPHAGE, GLUCOVANCE, AVANDAMET, RIOMET, FORTAMET, [place] MET, JANUMET, GLUMETZA or METAGLIP, you MAY be asked to HOLD this medication 48 hours AFTER the exam.   The purpose of you drinking the oral contrast is to aid in the visualization of your intestinal tract. The contrast solution may cause some diarrhea. Depending on your individual set of symptoms, you may also receive an intravenous injection of x-ray contrast/dye. Plan on being at [facility] for 45 minutes or longer, depending on the type of exam you  are having performed.   If you have any questions regarding your exam or if you need to reschedule, you may call [facility] at [phone] between the hours of 8:00 am and 5:00 pm, Monday-Friday.   It was a pleasure to see you today!  Thank you for trusting me with your gastrointestinal care!    Scott E.Candis Schatz, MD   ________________________________________________________  The [facility] providers would like to encourage you to use [facility] to communicate with providers for non-urgent requests or questions.  Due to long hold times on the telephone, sending your provider a message by [facility] may be a faster and more efficient way to get a response.  Please allow 48 business hours for a response.  Please remember that this is for non-urgent requests.  _______________________________________________________

## 2022-08-07 NOTE — Progress Notes (Signed)
HPI : Gabrielle Edwards is a very pleasant 32 year old female with a history of diabetes is referred to [place] by Dr. Maximiano Coss for further evaluation of and management of interminate liver lesions.  She underwent a RUQUS in 2021 to evaluate elevated liver enzymes and was found to have hepatic steatosis as well as multiple hypoechoic lesions of indeterminate significance.  An MRI was recommended, but was not completed.   In June 2023, an [place] was ordered to evaluate upper abdominal pain and again showed hepatic steatosis and again multiple indeterminate hypoechoic lesions.  MRI was again recommended and completed in July, showing multiple enhancing lesions which appeared to be stable since 2021.  A benign etiology was favored, with adenoma vs FNH being the most likely.  Repeat MRI in 3 months was recommended. The patient denies taking OCPs, currently or in the past.  She has no family history of liver disease or malignancy. She does have mild chronic epigastric abdominal pain which has improved with pantoprazole.   She has diabetes and is overweight.  She does not drink alcohol.  She has no history of yellowing of the eyes or skin.  No abdominal or lower extremity swelling.   MRI liver February 24, 2022 IMPRESSION: 1. Indeterminate enhancing lesions within the liver parenchyma. Lesions have been present since ultrasound 07/25/2020 therefore favor benign or least indolent neoplasm. Top differential would include hepatic adenomas versus focal nodular hyperplasia. Recommend cessation oral contraceptives and follow-up MRI (3 months) with hepatocytes specific contrast agent (Eovist). Oral contraceptives can contribute to adenoma growth. 2. Diffuse hepatic steatosis. 3. Separate benign hemangioma in the RIGHT hepatic lobe.   RJJOA January 09, 2022 IMPRESSION: Multiple indeterminate solid masses within the liver. These need dedicated evaluation with pre and post contrast-enhanced abdominal MRI. Increased  hepatic parenchymal echogenicity suggestive of steatosis. No cholelithiasis or sonographic evidence for acute cholecystitis.   RUQUS Jul 25, 2020 IMPRESSION: Hepatic steatosis. Multifocal hypoechoic lesions measuring up to 2.0 cm, indeterminate. Consider MRI abdomen for better evaluation.  Past Medical History:  Diagnosis Date   BV (bacterial vaginosis)    Gestational diabetes    2015 and currently glyburide   Hormone imbalance    Infection    uti   Normal first pregnancy confirmed, currently in third trimester 07/16/2014   SVD (spontaneous vaginal delivery) 07/17/2014     Past Surgical History:  Procedure Laterality Date   CESAREAN SECTION N/A 01/25/2016   Procedure: CESAREAN SECTION;  Surgeon: Truett Mainland, DO;  Location: [facility];  Service: Obstetrics;  Laterality: N/A;   TUBAL LIGATION     Family History  Problem Relation Age of Onset   Hypertension Father    Lupus Maternal Aunt    Kidney disease Maternal Grandmother    Diabetes Maternal Grandmother    Heart disease Paternal Grandfather    Diabetes Maternal Grandfather    Hypertension Maternal Grandfather    Diabetes Paternal Grandmother    Hypertension Paternal Grandmother    Social History   Tobacco Use   Smoking status: Never   Smokeless tobacco: Never  Vaping Use   Vaping Use: Never used  Substance Use Topics   Alcohol use: No    Alcohol/week: 0.0 standard drinks of alcohol   Drug use: No   Current Outpatient Medications  Medication Sig Dispense Refill   acetaminophen (TYLENOL) 500 MG tablet Take 1,000 mg by mouth every 6 (six) hours as needed for mild pain or headache.     Blood Glucose  Monitoring Suppl (ACCU-CHEK AVIVA PLUS) w/Device KIT 1 Device by Does not apply route as directed. 1 kit 0   empagliflozin (JARDIANCE) 10 MG TABS tablet Take 1 tablet (10 mg total) by mouth daily before breakfast. 30 tablet 3   enalapril (VASOTEC) 2.5 MG tablet TAKE 1 TABLET (2.5 MG TOTAL) BY MOUTH EVERY  OTHER DAY. 270 tablet 1   glucose blood (RELION PREMIER TEST) test strip Use as instructed 100 each 12   ibuprofen (ADVIL,MOTRIN) 800 MG tablet Take 1 tablet (800 mg total) by mouth every 8 (eight) hours as needed. 30 tablet 0   pantoprazole (PROTONIX) 40 MG tablet Take 1 tablet (40 mg total) by mouth daily. 30 tablet 3   pravastatin (PRAVACHOL) 40 MG tablet Take 1 tablet (40 mg total) by mouth daily. 90 tablet 1   metFORMIN (GLUCOPHAGE-XR) 500 MG 24 hr tablet Take 1 tablet (500 mg total) by mouth daily with breakfast. 30 tablet 3   No current facility-administered medications for this visit.   Allergies  Allergen Reactions   Farxiga [Dapagliflozin] Hives and Itching   Metformin And Related Diarrhea and Nausea Only     Review of Systems: All systems reviewed and negative except where noted in HPI.    No results found.  Physical Exam: BP (!) 124/90   Pulse (!) 105   Ht 5' (1.524 m)   Wt 196 lb (88.9 kg)   LMP 07/17/2022   BMI 38.28 kg/m  Constitutional: Pleasant,well-developed female in no acute distress. HEENT: Normocephalic and atraumatic. Conjunctivae are normal. No scleral icterus. Neck supple.  Cardiovascular: Normal rate, regular rhythm.  Pulmonary/chest: Effort normal and breath sounds normal. No wheezing, rales or rhonchi. Abdominal: Soft, nondistended, nontender. Bowel sounds active throughout. There are no masses palpable. No hepatomegaly. Extremities: no edema Neurological: Alert and oriented to person place and time. Skin: Skin is warm and dry. No rashes noted. Psychiatric: Normal mood and affect. Behavior is normal.  CBC    Component Value Date/Time   WBC 11.1 (H) 04/18/2022 1500   RBC 5.30 (H) 04/18/2022 1500   HGB 11.7 (L) 04/18/2022 1500   HGB 13.0 06/21/2020 0811   HGB 11.6 11/09/2015 0000   HCT 37.4 04/18/2022 1500   HCT 41.3 06/21/2020 0811   HCT 35 11/09/2015 0000   PLT 288.0 04/18/2022 1500   PLT 288 06/21/2020 0811   PLT 217 11/09/2015 0000    MCV 70.7 (L) 04/18/2022 1500   MCV 78 (L) 06/21/2020 0811   MCH 24.6 (L) 06/21/2020 0811   MCH 27.2 01/27/2016 0914   MCHC 31.3 04/18/2022 1500   RDW 17.1 (H) 04/18/2022 1500   RDW 13.3 06/21/2020 0811   LYMPHSABS 3.1 04/18/2022 1500   LYMPHSABS 3.4 (H) 06/21/2020 0811   MONOABS 0.4 04/18/2022 1500   EOSABS 0.3 04/18/2022 1500   EOSABS 0.3 06/21/2020 0811   BASOSABS 0.1 04/18/2022 1500   BASOSABS 0.1 06/21/2020 0811    CMP     Component Value Date/Time   NA 136 01/01/2022 0948   NA 133 (L) 06/21/2020 0811   K 4.0 01/01/2022 0948   CL 102 01/01/2022 0948   CO2 25 01/01/2022 0948   GLUCOSE 235 (H) 01/01/2022 0948   BUN 8 01/01/2022 0948   BUN 8 06/21/2020 0811   CREATININE 0.45 01/01/2022 0948   CALCIUM 9.0 01/01/2022 0948   PROT 7.6 01/01/2022 0948   PROT 7.8 06/21/2020 0811   ALBUMIN 4.1 01/01/2022 0948   ALBUMIN 4.6 06/21/2020 0811   AST  36 01/01/2022 0948   ALT 58 (H) 01/01/2022 0948   ALKPHOS 95 01/01/2022 0948   BILITOT 0.3 01/01/2022 0948   BILITOT 0.2 06/21/2020 0811   GFRNONAA 125 06/21/2020 0811   GFRAA 144 06/21/2020 0811            Component Ref Range & Units 7 mo ago (01/01/22) 9 mo ago (10/29/21) 1 yr ago (10/24/20) 2 yr ago (06/21/20) 2 yr ago (11/18/19) 6 yr ago (01/27/16)  Sodium 135 - 145 mEq/L 136 135 136 133 Low  R 140 R   Potassium 3.5 - 5.1 mEq/L 4.0 4.5 3.9 4.1 R 3.7 R   Chloride 96 - 112 mEq/L 102 98 100 93 Low  R 97 R   CO2 19 - 32 mEq/L 25 25 24 22  R 22 R   Glucose, Bld 70 - 99 mg/dL High  765 High  465 High  380 High  R 139 High  R 108 High  R  BUN 6 - 23 mg/dL 8 8 8 8  R 13 R   Creatinine, Ser 0.40 - 1.20 mg/dL 035 4.65 6.81 R 2.75 R   Total Bilirubin 0.2 - 1.2 mg/dL 0.3 0.5 0.4 0.2 R 0.4 R   Alkaline Phosphatase 39 - 117 U/L 95 90 95 140 High  R, CM 106 R   AST 0 - 37 U/L 36 51 High  166 High  122 High  R 103 High  R   ALT 0 - 35 U/L 58 High  70 High  133 High  169 High  R 192 High  R   Total Protein 6.0 - 8.3 g/dL 7.6 7.6 7.7  7.8 R 8.3 R   Albumin 3.5 - 5.2 g/dL 4.1 4.4 4.2 4.6 R 5.1 High  R   GFR >60.00 mL/min 128.09 126.91 CM 129.87 CM     Comment: Calculated using the CKD-EPI Creatinine Equation (2021)  Calcium 8.4 - 10.5 mg/dL 9.0 9.8 9.0 9.9 R 0.17 High  R      ASSESSMENT AND PLAN: 32 year old female with history of obesity, diabetes and fatty liver, with multiple indeterminate liver lesions, most likely hepatic adenomas vs FNH.  Repeat hepatic MRI recommended in 3 months.  Will order this today.  Her elevated liver enzymes and steatosis are most likely secondary to NAFLD.  Will exclude other causes of chronic liver disease (viral, autoimmune, genetic/metabolic).  We discussed the pathophysiology and natural history of fatty liver disease, to include the risk of progression to NASH/cirrhosis, as well as the principles of fatty disease management, which center around weight loss through improvements in diet and exercise.  I reiterated the importance of limiting consumption of sugary foods/beverages and incorporating regular physical activity, and the role of black coffee in improving objective parameters in fatty liver disease.   Indeterminate liver lesions, favor FNH - Repeat MRI liver  Fatty liver/elevated liver enzymes - Exclude other causes of chronic liver disease - Exercise/coffee/diet  Upper abdominal discomfort - Improved with PPI - Defer endoscopy for now - Unlikely to be related to hepatic lesions  Denae Zulueta E. 49.4, MD [facility]   33, NP

## 2022-08-08 ENCOUNTER — Encounter: Payer: Self-pay | Admitting: Family Medicine

## 2022-08-08 ENCOUNTER — Ambulatory Visit: Payer: BC Managed Care – PPO | Admitting: Family Medicine

## 2022-08-08 VITALS — BP 120/70 | HR 90 | Temp 97.4°F | Resp 16 | Ht 60.0 in | Wt 197.4 lb

## 2022-08-08 DIAGNOSIS — E785 Hyperlipidemia, unspecified: Secondary | ICD-10-CM

## 2022-08-08 DIAGNOSIS — E119 Type 2 diabetes mellitus without complications: Secondary | ICD-10-CM

## 2022-08-08 DIAGNOSIS — E1169 Type 2 diabetes mellitus with other specified complication: Secondary | ICD-10-CM

## 2022-08-08 LAB — LIPID PANEL
Cholesterol: 171 mg/dL (ref 0–200)
HDL: 57.3 mg/dL (ref >39.00)
LDL Cholesterol: 80 mg/dL (ref 0–99)
NonHDL: 113.48
Total CHOL/HDL Ratio: 3
Triglycerides: 167 mg/dL — ABNORMAL HIGH (ref 0.0–149.0)
VLDL: 33.4 mg/dL (ref 0.0–40.0)

## 2022-08-08 LAB — BASIC METABOLIC PANEL
BUN: 10 mg/dL (ref 6–23)
CO2: 29 mEq/L (ref 19–32)
Calcium: 9.7 mg/dL (ref 8.4–10.5)
Chloride: 99 mEq/L (ref 96–112)
Creatinine, Ser: 0.49 mg/dL (ref 0.40–1.20)
GFR: 124.96 mL/min (ref >60.00)
Glucose, Bld: 147 mg/dL — ABNORMAL HIGH (ref 70–99)
Potassium: 3.9 mEq/L (ref 3.5–5.1)
Sodium: 138 mEq/L (ref 135–145)

## 2022-08-08 LAB — CBC WITH DIFFERENTIAL/PLATELET
Basophils Absolute: 0 10*3/uL (ref 0.0–0.1)
Basophils Relative: 0.3 % (ref 0.0–3.0)
Eosinophils Absolute: 0.5 10*3/uL (ref 0.0–0.7)
Eosinophils Relative: 4 % (ref 0.0–5.0)
HCT: 37.9 % (ref 36.0–46.0)
Hemoglobin: 12 g/dL (ref 12.0–15.0)
Lymphocytes Relative: 33.4 % (ref 12.0–46.0)
Lymphs Abs: 4 10*3/uL (ref 0.7–4.0)
MCHC: 31.6 g/dL (ref 30.0–36.0)
MCV: 75.9 fl — ABNORMAL LOW (ref 78.0–100.0)
Monocytes Absolute: 0.5 10*3/uL (ref 0.1–1.0)
Monocytes Relative: 3.9 % (ref 3.0–12.0)
Neutro Abs: 6.9 10*3/uL (ref 1.4–7.7)
Neutrophils Relative %: 58.4 % (ref 43.0–77.0)
Platelets: 311 10*3/uL (ref 150.0–400.0)
RBC: 5 Mil/uL (ref 3.87–5.11)
RDW: 15.4 % (ref 11.5–15.5)
WBC: 11.8 10*3/uL — ABNORMAL HIGH (ref 4.0–10.5)

## 2022-08-08 LAB — MICROALBUMIN / CREATININE URINE RATIO
Creatinine,U: 90.3 mg/dL
Microalb Creat Ratio: 23.2 mg/g (ref 0.0–30.0)
Microalb, Ur: 20.9 mg/dL — ABNORMAL HIGH (ref 0.0–1.9)

## 2022-08-08 LAB — HEPATIC FUNCTION PANEL
ALT: 58 U/L — ABNORMAL HIGH (ref 0–35)
AST: 37 U/L (ref 0–37)
Albumin: 4.3 g/dL (ref 3.5–5.2)
Alkaline Phosphatase: 93 U/L (ref 39–117)
Bilirubin, Direct: 0.1 mg/dL (ref 0.0–0.3)
Total Bilirubin: 0.5 mg/dL (ref 0.2–1.2)
Total Protein: 7.7 g/dL (ref 6.0–8.3)

## 2022-08-08 LAB — TSH: TSH: 2.52 u[IU]/mL (ref 0.35–5.50)

## 2022-08-08 LAB — HEMOGLOBIN A1C: Hgb A1c MFr Bld: 9.3 % — ABNORMAL HIGH (ref 4.6–6.5)

## 2022-08-08 NOTE — Assessment & Plan Note (Signed)
Chronic problem.  On Pravastatin 40mg daily w/o difficulty.  Check labs.  Adjust meds prn  

## 2022-08-08 NOTE — Assessment & Plan Note (Signed)
New.  Pt's BMI is 38.55 but coupled w/ diabetes, hyperlipidemia, and HTN she qualifies as morbidly obese.  Stressed need for low carb diet and regular physical activity.  Will follow.

## 2022-08-08 NOTE — Assessment & Plan Note (Signed)
Chronic problem.  Last A1C was 9 months ago and was 9.9%.  She is on Glipizide Metformin 2.5/500mg  BID.  Due for microalbumin- ordered.  Due for eye exam- pt to schedule.  Foot exam done.  Currently asymptomatic but needs a better medication regimen based on age and A1C.  Pt agreeable to change.

## 2022-08-08 NOTE — Progress Notes (Signed)
   Subjective:    Patient ID: Gabrielle Edwards, female    DOB: 01-05-1991, 32 y.o.   MRN: [id number]  HPI DM- chronic problem.  Currently on Glipizide Metformin 2.5/500mg  BID.  Last A1C was April and very elevated at 9.9%.  On ACE for renal protection.  Due for microalbumin.  Due for eye exam, foot exam.  No CP, SOB, HA's, visual changes.  Denies numbness/tingling of hands/feet.  Hyperlipidemia- chronic problem, on Pravastatin 40mg  daily  Denies abd pain, N/V  Morbid obesity- pt's BMI is 38.55 which coupled w/ DM and hyperlipidemia qualifies as morbid obesity.  Pt has gained 5 lbs since Sept.  Pt does some stretching but no aerobic activity   Review of Systems For ROS see HPI     Objective:   Physical Exam Vitals reviewed.  Constitutional:      General: She is not in acute distress.    Appearance: Normal appearance. She is well-developed. She is obese. She is not ill-appearing.  HENT:     Head: Normocephalic and atraumatic.  Eyes:     Conjunctiva/sclera: Conjunctivae normal.     Pupils: Pupils are equal, round, and reactive to light.  Neck:     Thyroid: No thyromegaly.  Cardiovascular:     Rate and Rhythm: Normal rate and regular rhythm.     Pulses: Normal pulses.     Heart sounds: Normal heart sounds. No murmur heard. Pulmonary:     Effort: Pulmonary effort is normal. No respiratory distress.     Breath sounds: Normal breath sounds.  Abdominal:     General: There is no distension.     Palpations: Abdomen is soft.     Tenderness: There is no abdominal tenderness.  Musculoskeletal:     Cervical back: Normal range of motion and neck supple.     Right lower leg: No edema.     Left lower leg: No edema.  Lymphadenopathy:     Cervical: No cervical adenopathy.  Skin:    General: Skin is warm and dry.  Neurological:     General: No focal deficit present.     Mental Status: She is alert and oriented to person, place, and time.  Psychiatric:        Behavior: Behavior normal.            Assessment & Plan:

## 2022-08-08 NOTE — Patient Instructions (Signed)
Follow up in 3 months to recheck sugars We'll notify you of your lab results and make any changes Call and schedule your eye exam and have them send me a copy of their report Continue to work on low carb diet and regular exercise- you can do it! Call with any questions or concerns Stay Safe!  Stay Healthy! Good luck w/ surgery!!!

## 2022-08-09 ENCOUNTER — Telehealth: Payer: Self-pay

## 2022-08-09 ENCOUNTER — Telehealth: Payer: Self-pay | Admitting: Family Medicine

## 2022-08-09 ENCOUNTER — Other Ambulatory Visit: Payer: Self-pay | Admitting: Family Medicine

## 2022-08-09 ENCOUNTER — Other Ambulatory Visit: Payer: Self-pay

## 2022-08-09 MED ORDER — METFORMIN HCL 1000 MG PO TABS
1000.0000 mg | ORAL_TABLET | Freq: Every day | ORAL | 3 refills | Status: DC
Start: 2022-08-09 — End: 2022-08-09

## 2022-08-09 MED ORDER — EMPAGLIFLOZIN 10 MG PO TABS
10.0000 mg | ORAL_TABLET | Freq: Every day | ORAL | 3 refills | Status: DC
Start: 2022-08-09 — End: 2022-12-16

## 2022-08-09 MED ORDER — METFORMIN HCL ER 500 MG PO TB24
500.0000 mg | ORAL_TABLET | Freq: Every day | ORAL | 3 refills | Status: DC
Start: 2022-08-09 — End: 2022-09-30

## 2022-08-09 NOTE — Telephone Encounter (Signed)
Caller name: Haskell Flirt Wurtzel  On DPR?: Yes  Call back number: [phone] (mobile)  Provider they see: Midge Minium, MD  Reason for call: Patient called to get her lab results faxed to her oral surgeon Dr. Michael Litter at [facility]. Fax number is [phone].

## 2022-08-09 NOTE — Telephone Encounter (Signed)
Shiny from [facility] called in regards to Metformin dosage

## 2022-08-09 NOTE — Telephone Encounter (Signed)
I have called this pharmacy x 3 and been on hold for well over an hour . Metformin dosage is 500 XR once daily . Pt informed as well left this on pharmacy VM since no one will answer the phone

## 2022-08-09 NOTE — Telephone Encounter (Signed)
Faxed results at pt request to [phone]

## 2022-08-09 NOTE — Progress Notes (Signed)
Metformin changed to 500mg  XR rather than immediate release 1000mg  due to previous GI issues (prescription sent)

## 2022-08-09 NOTE — Telephone Encounter (Signed)
Informed pt of lab results sent in Jardiacne 10 mg and Metformin 1000

## 2022-08-09 NOTE — Telephone Encounter (Signed)
-----  Message from Midge Minium, MD sent at 08/09/2022  7:38 AM EST ----- Labs look good w/ exception of A1C- which is well above goal.  Based on this, we are going to make medication to get this under better control.  STOP the Glipizide/Metformin combo START Jardiance 10mg  daily (#30, 3 refills).  This medicine will improve your sugar, help protect your kidneys, and protect your heart. START Metformin XR 1000mg  daily (#30, 3 refills).  This is similar to the Metformin you were taking but it's only once a day  Make sure you are being careful about the amount of carbs/sugar you are eating and try and get some form of regular physical activity (like walking)

## 2022-08-12 LAB — MITOCHONDRIAL ANTIBODIES: Mitochondrial M2 Ab, IgG: <=20 U (ref <=20.0)

## 2022-08-12 LAB — HEPATITIS B SURFACE ANTIBODY,QUALITATIVE: Hep B S Ab: NONREACTIVE

## 2022-08-12 LAB — IGG: IgG (Immunoglobin G), Serum: 1151 mg/dL (ref 600–1640)

## 2022-08-12 LAB — ANTI-SMOOTH MUSCLE ANTIBODY, IGG: Actin (Smooth Muscle) Antibody (IGG): <20 U (ref <20)

## 2022-08-12 LAB — HEPATITIS B SURFACE ANTIGEN: Hepatitis B Surface Ag: NONREACTIVE

## 2022-08-12 LAB — HEPATITIS A ANTIBODY, TOTAL: Hepatitis A AB,Total: NONREACTIVE

## 2022-08-12 LAB — ALPHA-1-ANTITRYPSIN: A-1 Antitrypsin, Ser: 134 mg/dL (ref 83–199)

## 2022-08-12 LAB — HEPATITIS C ANTIBODY: Hepatitis C Ab: NONREACTIVE

## 2022-08-13 ENCOUNTER — Telehealth: Payer: Self-pay | Admitting: Gastroenterology

## 2022-08-13 ENCOUNTER — Other Ambulatory Visit: Payer: Self-pay

## 2022-08-13 DIAGNOSIS — R932 Abnormal findings on diagnostic imaging of liver and biliary tract: Secondary | ICD-10-CM

## 2022-08-13 NOTE — Telephone Encounter (Signed)
Patient called, stated she has a MRI scheduled. Patient stated she got a call that the MRI would be $3,000. She stated she would not be able to proceed with MRI because of cost.

## 2022-08-13 NOTE — Telephone Encounter (Signed)
Dr. Candis Schatz see note below. Pt states she cannot afford to do another MRI. States her labs were normal and she would be willing to do an [place] but cannot afford the MRI. Please advise.

## 2022-08-13 NOTE — Telephone Encounter (Signed)
Pt aware and order placed for CT of abd/liver protocol. She knows she will be contacted by rad scheduling to set up CT scan.

## 2022-08-14 NOTE — Progress Notes (Signed)
Kyleen,  All the tests for other causes of chronic liver disease were unremarkable.  Your liver enzymes are most likely elevated from nonalcoholic liver disease (NAFLD).  This is related to your diabetes and diet/exercise.  The treatment is weight loss and control of diabetes.  Most patients with NAFLD do not develop significant liver disease, but about 20% of patients can develop serious liver problems.  Given your young age, I would be concerned about your risk of serious liver disease in the next 15-20 years.  Please follow up with me in 6 months.  Your iron panel showed a very mild iron deficiency, but I don't know that you necessarily need to take any iron supplements.  You are not immune to hepatitis A or B, and persons with any liver disease are recommended to be vaccinated.  We can provide those vaccines if you are agreeable.  Vaughan Basta,  Can you put in a reminder for a 6 month follow up clinic visit for fatty liver?

## 2022-08-15 ENCOUNTER — Encounter (HOSPITAL_COMMUNITY): Payer: Self-pay

## 2022-08-15 ENCOUNTER — Ambulatory Visit (HOSPITAL_COMMUNITY): Payer: BC Managed Care – PPO

## 2022-08-21 ENCOUNTER — Telehealth: Payer: Self-pay | Admitting: Gastroenterology

## 2022-08-21 ENCOUNTER — Other Ambulatory Visit: Payer: Self-pay

## 2022-08-21 DIAGNOSIS — K760 Fatty (change of) liver, not elsewhere classified: Secondary | ICD-10-CM

## 2022-08-21 DIAGNOSIS — R932 Abnormal findings on diagnostic imaging of liver and biliary tract: Secondary | ICD-10-CM

## 2022-08-21 NOTE — Telephone Encounter (Signed)
Pt aware, order in epic for lab. States she will come in this afternoon.

## 2022-08-21 NOTE — Telephone Encounter (Signed)
PT has a CT scheduled for 1/26 and she wants to cancel it because she cannot afford to pay upfront. Please advise.

## 2022-08-21 NOTE — Telephone Encounter (Signed)
Pt cancelled MRI as it was to expensive. She was scheduled for CT scan and is now requesting it be cancelled because she cannot afford it. Appt cancelled and Dr. Candis Schatz notified.

## 2022-08-23 ENCOUNTER — Other Ambulatory Visit: Payer: BC Managed Care – PPO

## 2022-08-23 ENCOUNTER — Ambulatory Visit (HOSPITAL_COMMUNITY): Payer: BC Managed Care – PPO

## 2022-08-23 DIAGNOSIS — K760 Fatty (change of) liver, not elsewhere classified: Secondary | ICD-10-CM

## 2022-08-23 DIAGNOSIS — R932 Abnormal findings on diagnostic imaging of liver and biliary tract: Secondary | ICD-10-CM

## 2022-08-24 ENCOUNTER — Other Ambulatory Visit: Payer: Self-pay | Admitting: Family Medicine

## 2022-08-24 DIAGNOSIS — R1013 Epigastric pain: Secondary | ICD-10-CM

## 2022-08-26 LAB — AFP TUMOR MARKER: AFP-Tumor Marker: 2.3 ng/mL

## 2022-08-27 NOTE — Progress Notes (Signed)
Gabrielle Edwards,  Your AFP level (tumor marker for liver cancer) was normal.  This is reassuring, but is far less informative than the imaging studies recommended.  Please let us know when you would be able to reconsider proceeding with either the CT or the MRI of your liver, so that we can more definitively say that this is a benign lesion.

## 2022-09-19 ENCOUNTER — Encounter: Payer: Self-pay | Admitting: Family Medicine

## 2022-09-19 ENCOUNTER — Ambulatory Visit: Payer: BC Managed Care – PPO | Admitting: Family Medicine

## 2022-09-19 VITALS — BP 128/82 | HR 79 | Temp 97.9°F | Resp 16 | Ht 60.0 in | Wt 186.2 lb

## 2022-09-19 DIAGNOSIS — E119 Type 2 diabetes mellitus without complications: Secondary | ICD-10-CM | POA: Diagnosis not present

## 2022-09-19 DIAGNOSIS — N760 Acute vaginitis: Secondary | ICD-10-CM

## 2022-09-19 DIAGNOSIS — K5900 Constipation, unspecified: Secondary | ICD-10-CM

## 2022-09-19 MED ORDER — SITAGLIPTIN PHOSPHATE 100 MG PO TABS: 100.0000 mg | ORAL_TABLET | Freq: Every day | ORAL | 3 refills | Status: DC

## 2022-09-19 MED ORDER — FLUCONAZOLE 150 MG PO TABS: 150.0000 mg | ORAL_TABLET | Freq: Once | ORAL | 0 refills | Status: AC

## 2022-09-19 NOTE — Patient Instructions (Signed)
Follow up as needed or as scheduled STOP the Jardiance START the [place] today and then repeat in 3 days CONTINUE to drink LOTS of water USE the fiber supplements daily I LOVE the low carb, low sugar diet!!! Call with any questions or concerns Stay Safe!  Stay Healthy!!

## 2022-09-19 NOTE — Progress Notes (Signed)
   Subjective:    Patient ID: Gabrielle Edwards, female    DOB: 02-18-1991, 32 y.o.   MRN: [id number]  HPI Vaginitis- pt started Jardiance in January.  Since then this is 2nd yeast infection since starting medication.  Pt tried AZO x3 days w/o relief.  Pt is having itching, burning, white clumpy discharge.  Constipation- pt reports she eliminated most fruits which caused constipation.  Is drinking water.  Started fiber supplement.  This has improved sxs.  DM- pt has been limiting carbs, down 10 lbs since last visit.  Home CBGs 165.   Review of Systems For ROS see HPI     Objective:   Physical Exam Vitals reviewed.  Constitutional:      General: She is not in acute distress.    Appearance: Normal appearance. She is not ill-appearing.  HENT:     Head: Normocephalic and atraumatic.  Eyes:     Extraocular Movements: Extraocular movements intact.     Conjunctiva/sclera: Conjunctivae normal.     Pupils: Pupils are equal, round, and reactive to light.  Cardiovascular:     Rate and Rhythm: Normal rate and regular rhythm.  Pulmonary:     Effort: Pulmonary effort is normal. No respiratory distress.  Musculoskeletal:     Cervical back: Normal range of motion and neck supple.  Skin:    General: Skin is warm and dry.  Neurological:     General: No focal deficit present.     Mental Status: She is alert and oriented to person, place, and time.     Motor: No weakness.     Coordination: Coordination normal.  Psychiatric:        Mood and Affect: Mood normal.        Behavior: Behavior normal.        Thought Content: Thought content normal.           Assessment & Plan:  Vaginitis- new.  Pt has had 2 yeast infxns since starting Jardiance last month.  Will treat w/ Diflucan as OTC products were not successful.  Switch medication to lessen risk in the future.  Pt expressed understanding and is in agreement w/ plan.   Constipation- new.  Pt reports this has improved w/ daily use of fiber  gummies.  Reassured her that this is safe and she is able to continue.  Will follow.

## 2022-09-20 NOTE — Assessment & Plan Note (Signed)
Pt reports Jardiance dramatically decreased her sugars but this is her 2nd yeast infxn since starting meds 1 month ago.  Will switch from Jardiance to Januvia due to side effects.  Encouraged her to continue her low carb diet and add regular physical activity.  Will follow.

## 2022-09-28 DIAGNOSIS — Z0279 Encounter for issue of other medical certificate: Secondary | ICD-10-CM | POA: Diagnosis not present

## 2022-09-29 ENCOUNTER — Other Ambulatory Visit: Payer: Self-pay | Admitting: Family Medicine

## 2022-09-30 ENCOUNTER — Encounter: Payer: Self-pay | Admitting: Gastroenterology

## 2022-11-04 ENCOUNTER — Ambulatory Visit: Payer: BC Managed Care – PPO | Admitting: Obstetrics and Gynecology

## 2022-11-06 ENCOUNTER — Ambulatory Visit: Payer: BC Managed Care – PPO | Admitting: Family Medicine

## 2022-11-22 IMAGING — US US ABDOMEN LIMITED
1 series · 14 of 25 positions shown · non-contrast
Comparison: None.

CLINICAL DATA: elevated lfts, suspect nafld

EXAM:
ULTRASOUND ABDOMEN LIMITED RIGHT UPPER QUADRANT

[Series 1: us abdomen limited · 0.25mm/px · 14 of 62 slices shown]
[im 1/62]
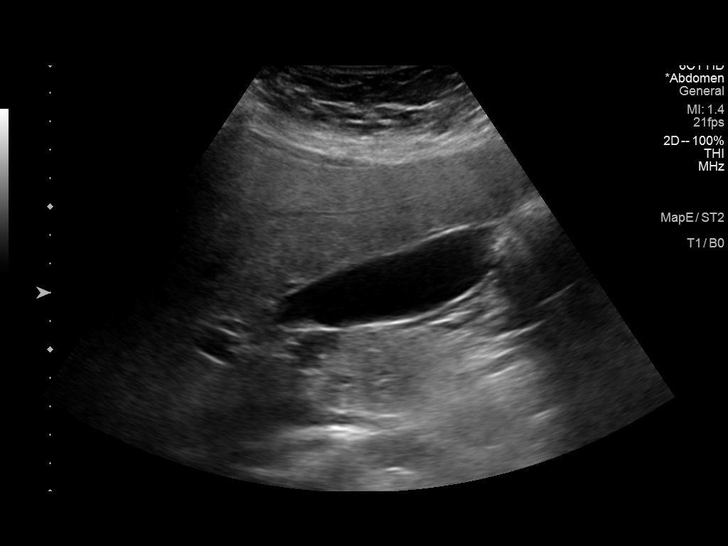
[im 6/62]
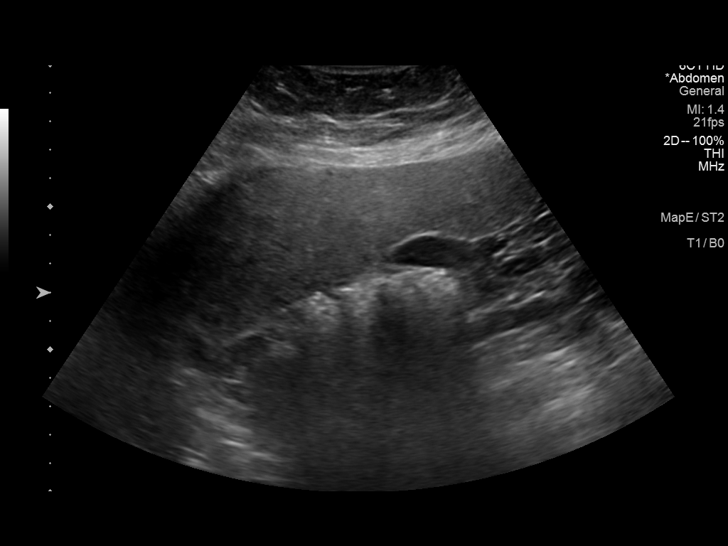
[im 11/62]
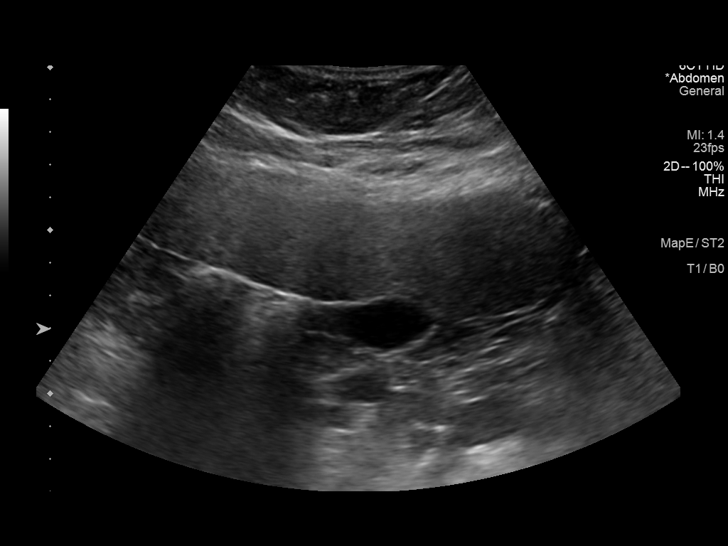
[im 16/62]
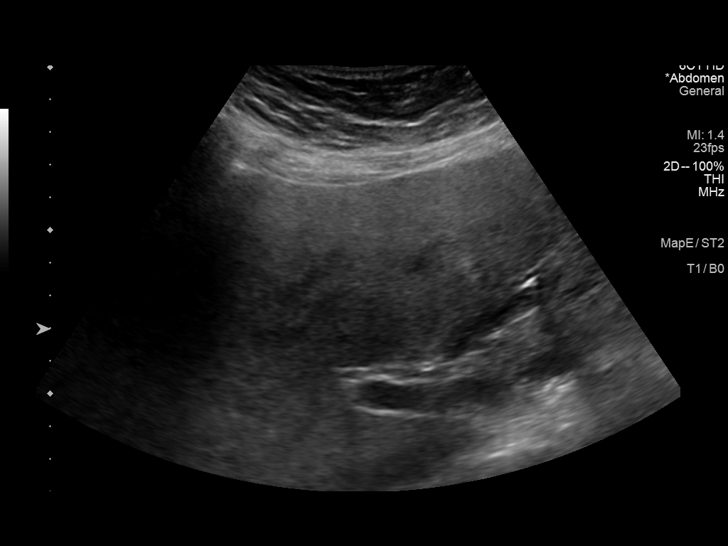
[im 21/62]
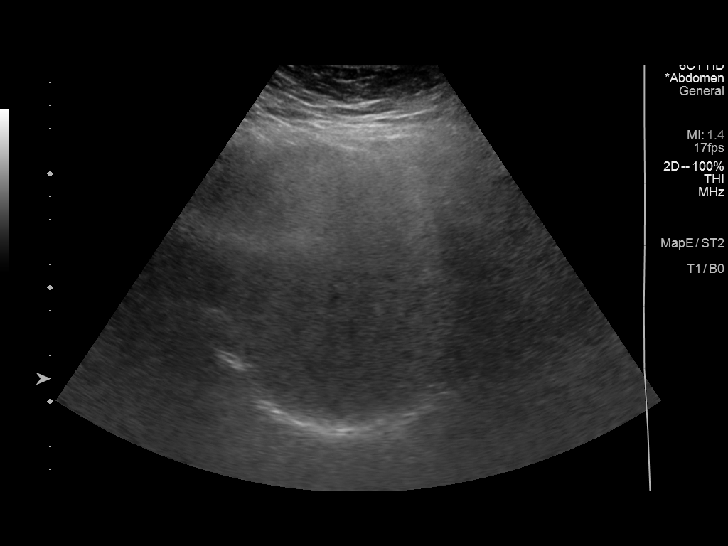
[im 23/62]
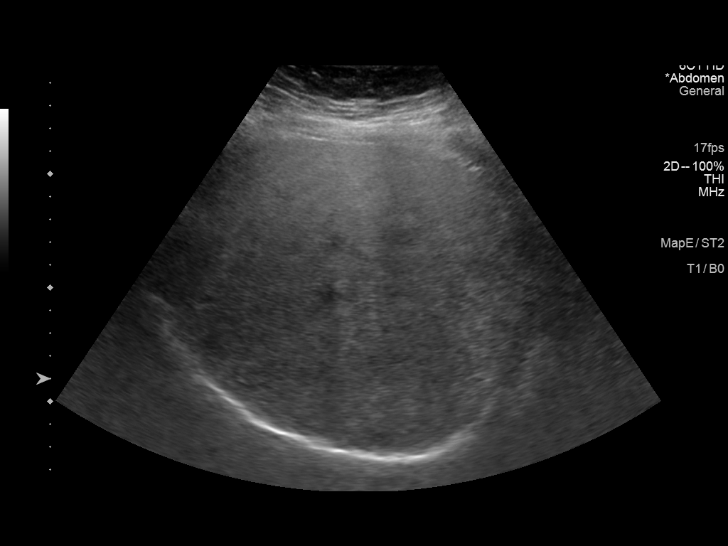
[im 28/62]
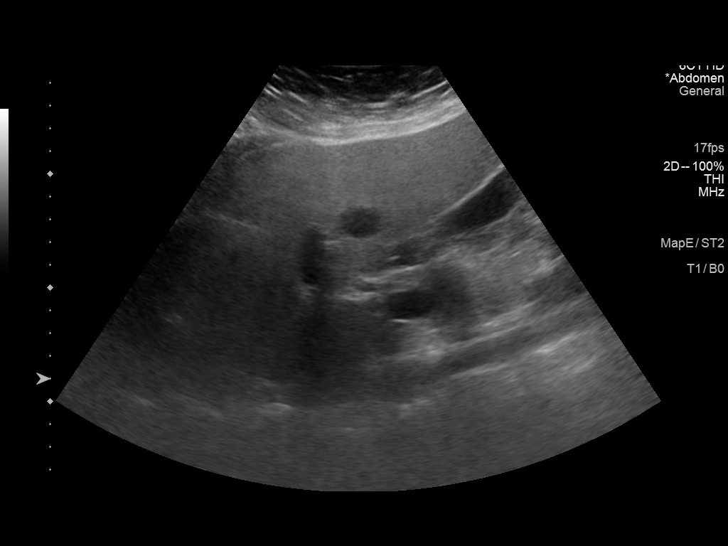
[im 34/62]
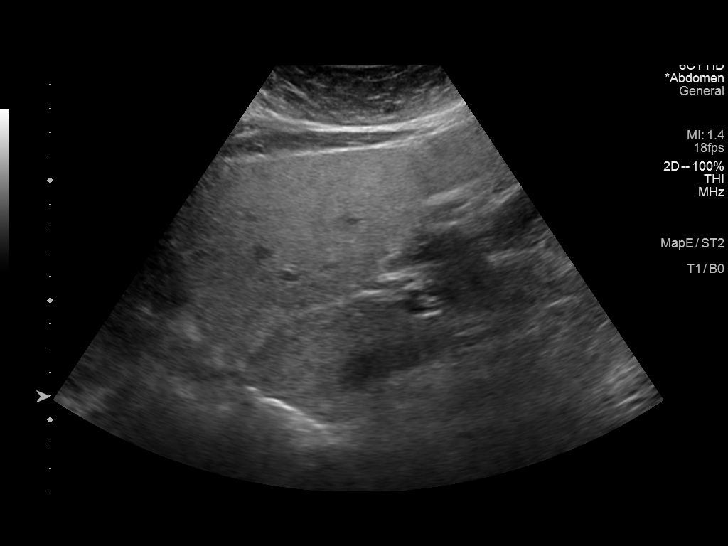
[im 39/62]
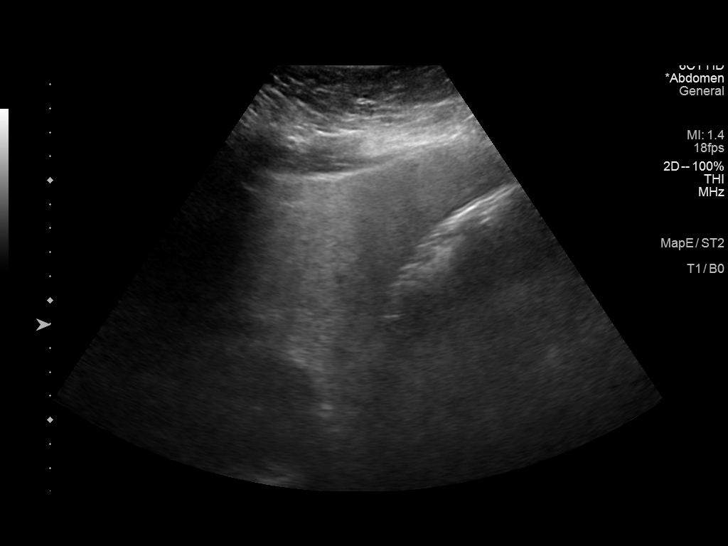
[im 41/62]
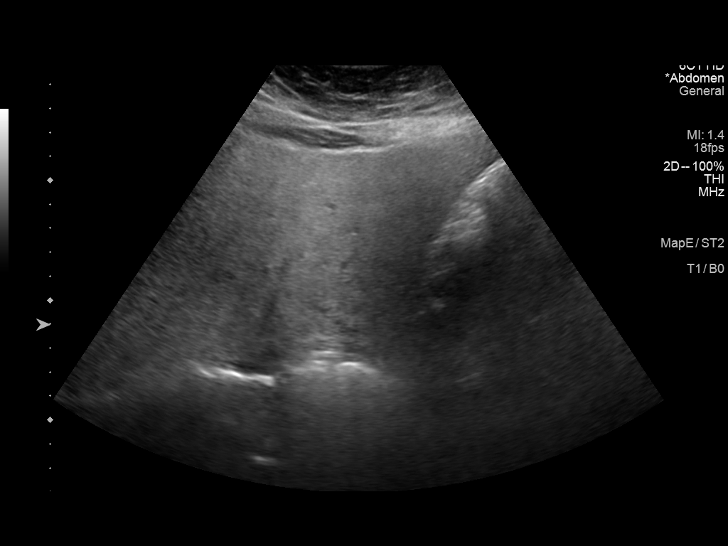
[im 46/62]
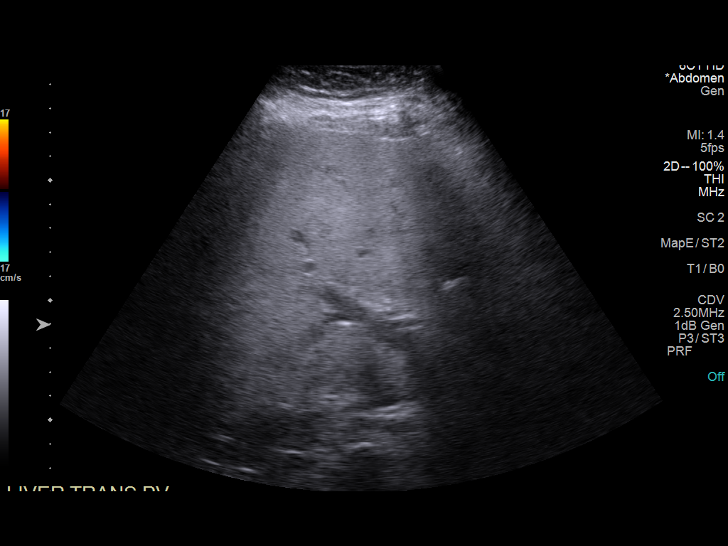
[im 51/62]
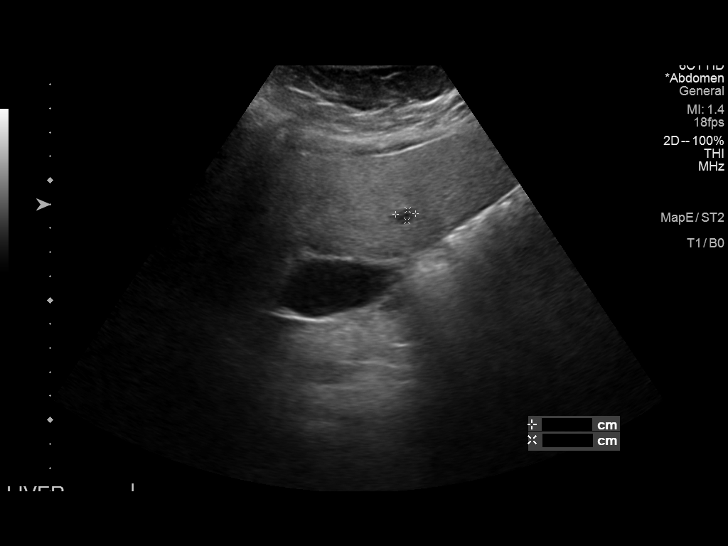
[im 56/62]
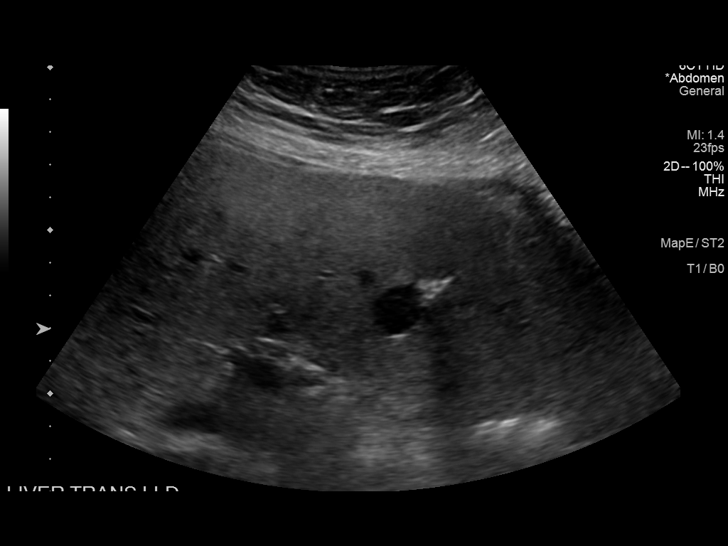
[im 62/62]
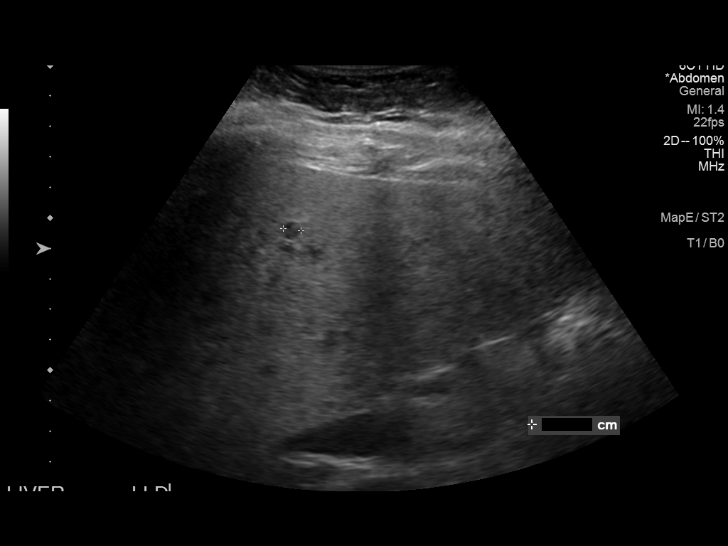

[14 of 25 positions shown; findings below may reference images not displayed]

FINDINGS: Gallbladder:

No gallstones or wall thickening visualized. No sonographic Murphy
sign noted by sonographer.

Common bile duct:

Diameter: 3.1 mm

Liver:

Multifocal hypoechoic lesions measuring up to 2.0 x 1.4 x 1.9 cm
within the right hepatic lobe. Increased parenchymal echogenicity.
Portal vein is patent on color Doppler imaging with normal direction
of blood flow towards the liver.

Other: None.
IMPRESSION: Hepatic steatosis. Multifocal hypoechoic lesions measuring up to
cm, indeterminate.

Consider MRI abdomen for better evaluation.

## 2022-12-16 ENCOUNTER — Ambulatory Visit: Payer: BC Managed Care – PPO | Admitting: Family Medicine

## 2022-12-16 VITALS — BP 124/82 | HR 82 | Temp 97.9°F | Resp 17 | Ht 60.0 in | Wt 192.5 lb

## 2022-12-16 DIAGNOSIS — E119 Type 2 diabetes mellitus without complications: Secondary | ICD-10-CM

## 2022-12-16 DIAGNOSIS — I10 Essential (primary) hypertension: Secondary | ICD-10-CM | POA: Diagnosis not present

## 2022-12-16 DIAGNOSIS — Z7984 Long term (current) use of oral hypoglycemic drugs: Secondary | ICD-10-CM

## 2022-12-16 DIAGNOSIS — E785 Hyperlipidemia, unspecified: Secondary | ICD-10-CM

## 2022-12-16 DIAGNOSIS — E1169 Type 2 diabetes mellitus with other specified complication: Secondary | ICD-10-CM | POA: Diagnosis not present

## 2022-12-16 LAB — CBC WITH DIFFERENTIAL/PLATELET
Basophils Absolute: 0 10*3/uL (ref 0.0–0.1)
Basophils Relative: 0.2 % (ref 0.0–3.0)
Eosinophils Absolute: 0.3 10*3/uL (ref 0.0–0.7)
Eosinophils Relative: 2.7 % (ref 0.0–5.0)
HCT: 36 % (ref 36.0–46.0)
Hemoglobin: 11.7 g/dL — ABNORMAL LOW (ref 12.0–15.0)
Lymphocytes Relative: 28.6 % (ref 12.0–46.0)
Lymphs Abs: 3.2 10*3/uL (ref 0.7–4.0)
MCHC: 32.6 g/dL (ref 30.0–36.0)
MCV: 75.8 fl — ABNORMAL LOW (ref 78.0–100.0)
Monocytes Absolute: 0.5 10*3/uL (ref 0.1–1.0)
Monocytes Relative: 4.4 % (ref 3.0–12.0)
Neutro Abs: 7.1 10*3/uL (ref 1.4–7.7)
Neutrophils Relative %: 64.1 % (ref 43.0–77.0)
Platelets: 291 10*3/uL (ref 150.0–400.0)
RBC: 4.74 Mil/uL (ref 3.87–5.11)
RDW: 15.6 % — ABNORMAL HIGH (ref 11.5–15.5)
WBC: 11.1 10*3/uL — ABNORMAL HIGH (ref 4.0–10.5)

## 2022-12-16 LAB — BASIC METABOLIC PANEL
BUN: 10 mg/dL (ref 6–23)
CO2: 26 mEq/L (ref 19–32)
Calcium: 9.6 mg/dL (ref 8.4–10.5)
Chloride: 99 mEq/L (ref 96–112)
Creatinine, Ser: 0.47 mg/dL (ref 0.40–1.20)
GFR: 125.91 mL/min (ref >60.00)
Glucose, Bld: 199 mg/dL — ABNORMAL HIGH (ref 70–99)
Potassium: 3.9 mEq/L (ref 3.5–5.1)
Sodium: 136 mEq/L (ref 135–145)

## 2022-12-16 LAB — LIPID PANEL
Cholesterol: 172 mg/dL (ref 0–200)
HDL: 57.7 mg/dL (ref >39.00)
LDL Cholesterol: 81 mg/dL (ref 0–99)
NonHDL: 114.78
Total CHOL/HDL Ratio: 3
Triglycerides: 168 mg/dL — ABNORMAL HIGH (ref 0.0–149.0)
VLDL: 33.6 mg/dL (ref 0.0–40.0)

## 2022-12-16 LAB — HEPATIC FUNCTION PANEL
ALT: 38 U/L — ABNORMAL HIGH (ref 0–35)
AST: 26 U/L (ref 0–37)
Albumin: 4.2 g/dL (ref 3.5–5.2)
Alkaline Phosphatase: 84 U/L (ref 39–117)
Bilirubin, Direct: 0.1 mg/dL (ref 0.0–0.3)
Total Bilirubin: 0.4 mg/dL (ref 0.2–1.2)
Total Protein: 7.5 g/dL (ref 6.0–8.3)

## 2022-12-16 LAB — HEMOGLOBIN A1C: Hgb A1c MFr Bld: 9.4 % — ABNORMAL HIGH (ref 4.6–6.5)

## 2022-12-16 MED ORDER — METFORMIN HCL ER 500 MG PO TB24: ORAL_TABLET | ORAL | 1 refills | Status: DC

## 2022-12-16 NOTE — Assessment & Plan Note (Signed)
Deteriorated.  Pt has gained 6 lbs since last visit.  Stressed need for low carb diet and regular exercise.  Will continue to follow. 

## 2022-12-16 NOTE — Assessment & Plan Note (Signed)
Chronic problem, on Pravastatin 40mg daily w/o difficulty.  Check labs.  Adjust meds prn  

## 2022-12-16 NOTE — Assessment & Plan Note (Signed)
Last A1C 9.3  She loved the Jardiance but had recurrent yeast infections so we switched to Januvia.  She doesn't like this medication as much as she feels it makes her tired.  UTD on foot exam, microalbumin.  Due for eye exam.  Encouraged her to switch Januvia to evening and we may be able to do another challenge w/ Jardiance in the future.  Check labs.  Adjust meds prn

## 2022-12-16 NOTE — Patient Instructions (Signed)
Follow up in 3-4 months to recheck diabetes We'll notify you of your lab results and make any changes if needed Continue to work on healthy diet and regular exercise- you can do it!!! Take the Enalapril every day Switch the Januvia to night and see if that helps w/ energy Call with any questions or concerns Stay Safe!  Stay Healthy! Have a great summer!!!

## 2022-12-16 NOTE — Assessment & Plan Note (Signed)
Chronic problem.  Well controlled.  Pt would like to take Enalapril daily to make things easier- agree.  Check labs due to ACE use but no anticipated med changes.  Will follow.

## 2022-12-16 NOTE — Progress Notes (Signed)
   Subjective:    Patient ID: Gabrielle Edwards, female    DOB: 1990/08/10, 32 y.o.   MRN: [id number]  HPI DM- chronic problem, on Metformin XR 500mg  daily and Januvia 100mg  daily.  UTD on foot exam, microalbumin.  Due for eye exam.  + fruity smelling urine.  No numbness/tingling of hands/feet.  HTN- chronic problem, on enalapril 2.5mg  every other day.  Pt reports she would like to take medication daily.  No CP, SOB, HA's, visual changes, edema.  Hyperlipidemia- chronic problem, on Pravastatin 40mg  daily.  No abd pain, N/V.  Obesity- pt has gained 6 lbs since last visit.  Limited exercise.     Review of Systems For ROS see HPI     Objective:   Physical Exam Vitals reviewed.  Constitutional:      General: She is not in acute distress.    Appearance: Normal appearance. She is well-developed. She is obese. She is not ill-appearing.  HENT:     Head: Normocephalic and atraumatic.  Eyes:     Conjunctiva/sclera: Conjunctivae normal.     Pupils: Pupils are equal, round, and reactive to light.  Neck:     Thyroid: No thyromegaly.  Cardiovascular:     Rate and Rhythm: Normal rate and regular rhythm.     Pulses: Normal pulses.     Heart sounds: Normal heart sounds. No murmur heard. Pulmonary:     Effort: Pulmonary effort is normal. No respiratory distress.     Breath sounds: Normal breath sounds.  Abdominal:     General: There is no distension.     Palpations: Abdomen is soft.     Tenderness: There is no abdominal tenderness.  Musculoskeletal:     Cervical back: Normal range of motion and neck supple.     Right lower leg: No edema.     Left lower leg: No edema.  Lymphadenopathy:     Cervical: No cervical adenopathy.  Skin:    General: Skin is warm and dry.  Neurological:     General: No focal deficit present.     Mental Status: She is alert and oriented to person, place, and time.  Psychiatric:        Mood and Affect: Mood normal.        Behavior: Behavior normal.         Thought Content: Thought content normal.           Assessment & Plan:

## 2022-12-17 LAB — TSH: TSH: 3.14 u[IU]/mL (ref 0.35–5.50)

## 2022-12-18 ENCOUNTER — Telehealth: Payer: Self-pay

## 2022-12-18 ENCOUNTER — Other Ambulatory Visit: Payer: Self-pay

## 2022-12-18 MED ORDER — EMPAGLIFLOZIN 10 MG PO TABS: 10.0000 mg | ORAL_TABLET | Freq: Every day | ORAL | 3 refills | Status: DC

## 2022-12-18 NOTE — Telephone Encounter (Signed)
Pt aware of lab results and Jardiacne 10 mg Has been sent in

## 2022-12-18 NOTE — Telephone Encounter (Signed)
-----   Message from Sheliah Hatch, MD sent at 12/17/2022  8:26 PM EDT ----- Labs are stable and look good w/ exception of A1C and sugar.  Your A1C remains much too high at 9.4%  Please work on a low carb, low sugar diet and we're going to restart the Jardiance 10mg  daily (#30, 3 refills).  This is in ADDITION to the Januvia and the Metformin- you will take all 3.

## 2023-01-10 ENCOUNTER — Other Ambulatory Visit: Payer: Self-pay | Admitting: Family Medicine

## 2023-01-10 DIAGNOSIS — E119 Type 2 diabetes mellitus without complications: Secondary | ICD-10-CM

## 2023-01-26 ENCOUNTER — Other Ambulatory Visit: Payer: Self-pay | Admitting: Family Medicine

## 2023-02-13 ENCOUNTER — Encounter: Payer: Self-pay | Admitting: Gastroenterology

## 2023-02-23 ENCOUNTER — Other Ambulatory Visit: Payer: Self-pay | Admitting: Family Medicine

## 2023-02-23 DIAGNOSIS — R1013 Epigastric pain: Secondary | ICD-10-CM

## 2023-02-26 ENCOUNTER — Encounter (INDEPENDENT_AMBULATORY_CARE_PROVIDER_SITE_OTHER): Payer: Self-pay

## 2023-03-08 IMAGING — US US PELVIS COMPLETE WITH TRANSVAGINAL
1 series · 13 of 25 positions shown · non-contrast
Comparison: None

CLINICAL DATA: Abnormal uterine bleeding, menorrhagia with
irregular cycle, LMP 10/10/2020



[Series 1: us pelvis complete with transvaginal · 0.25mm/px · 79 acquisitions, 13 frames shown]
[im 1/79]
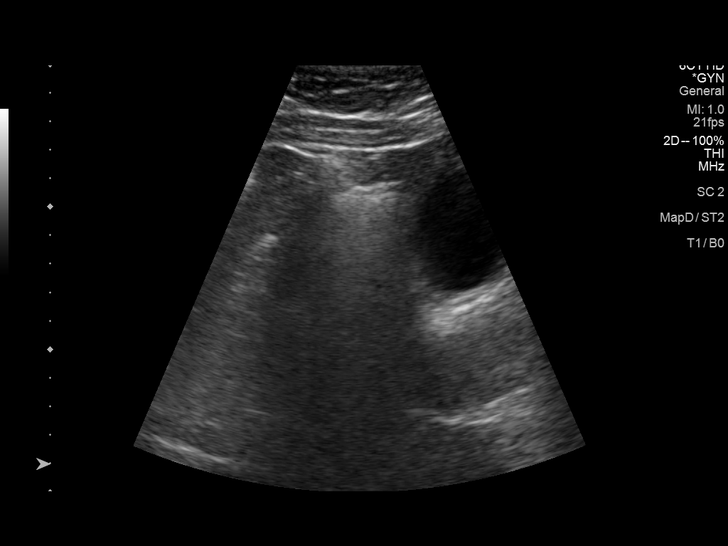
[im 7/79]
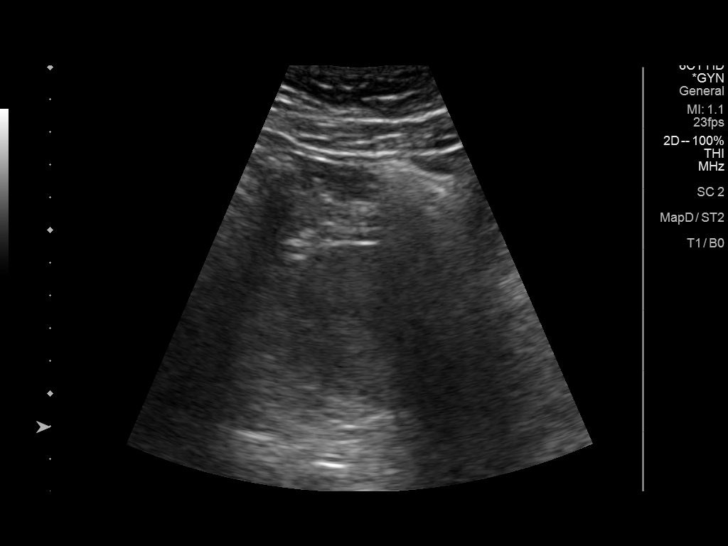
[im 14/79]
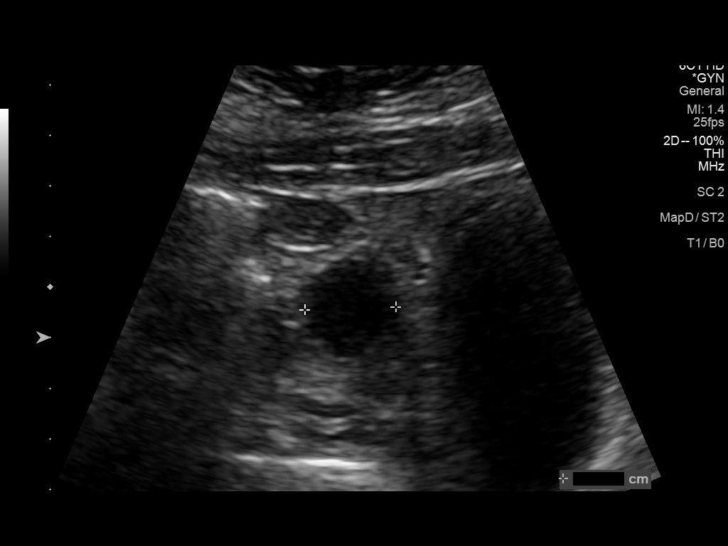
[im 20/79]
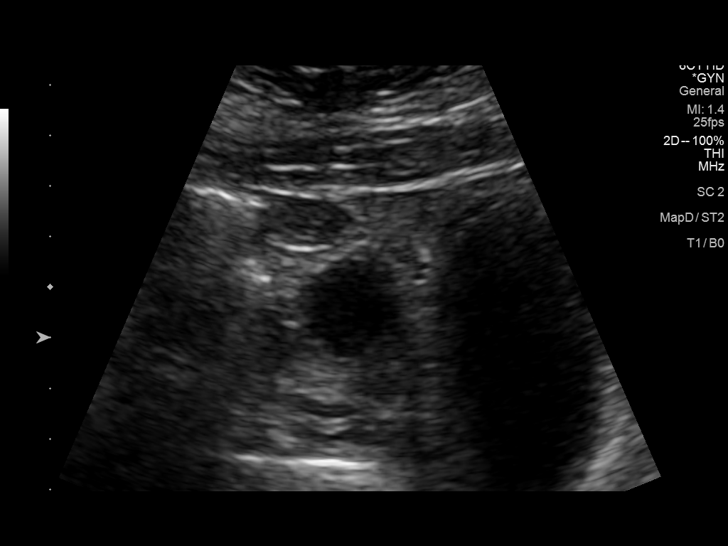
[im 27/79]
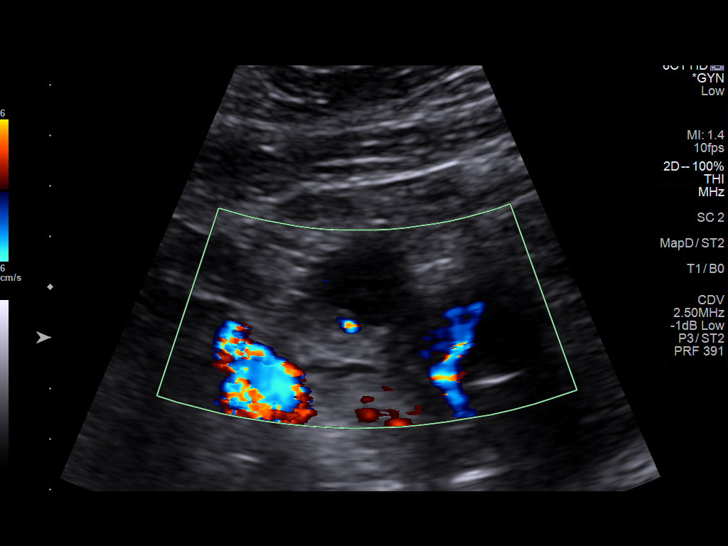
[im 33/79]
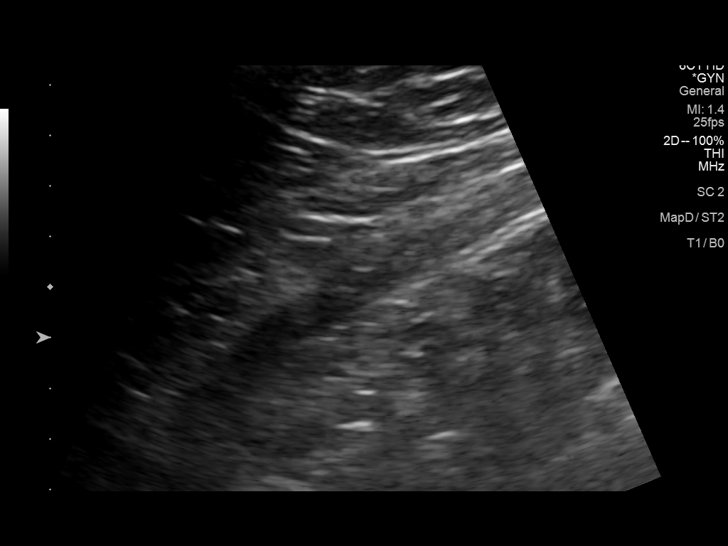
[im 40/79]
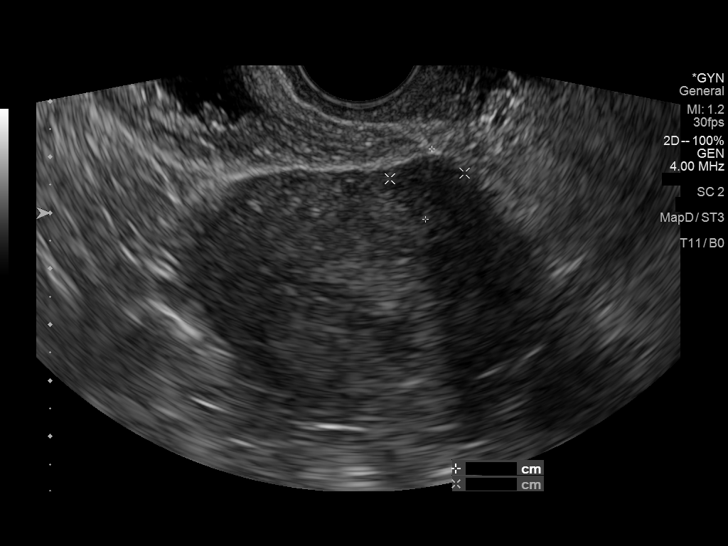
[im 46/79]
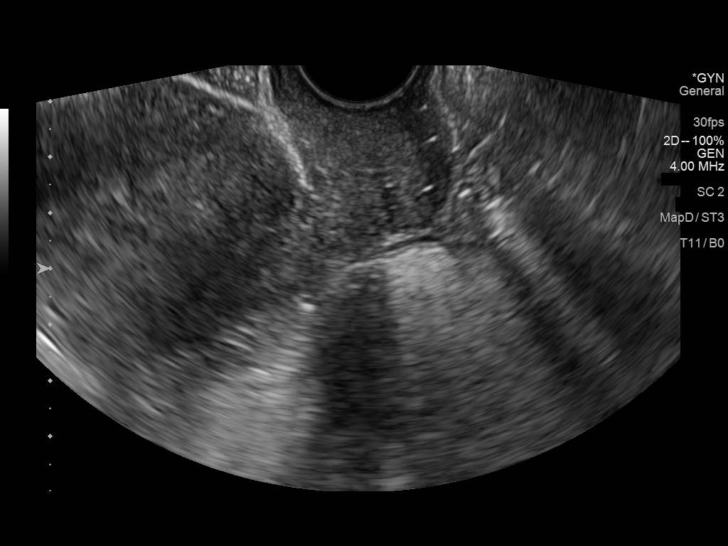
[im 53/79]
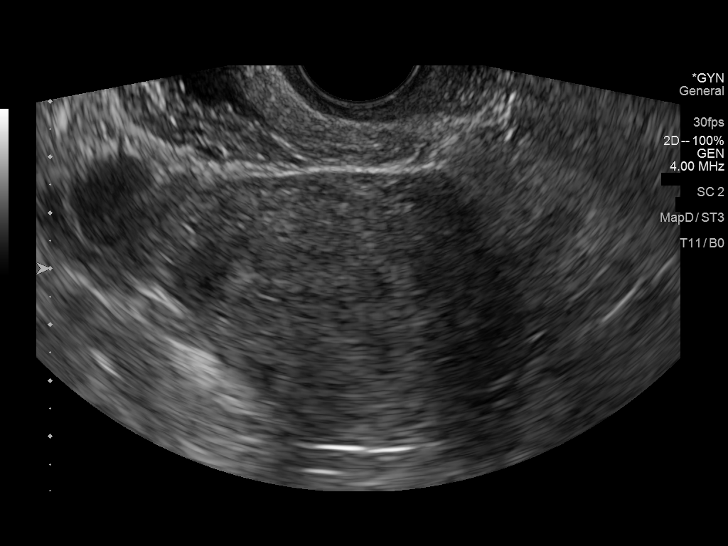
[im 59/79]
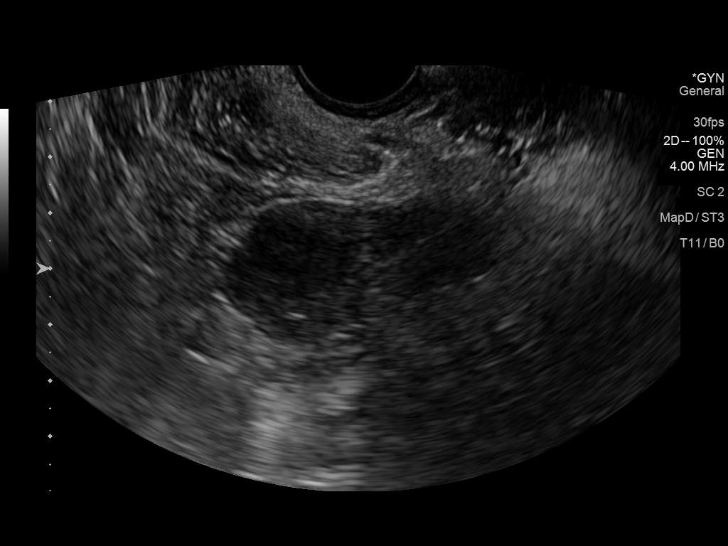
[im 66/79]
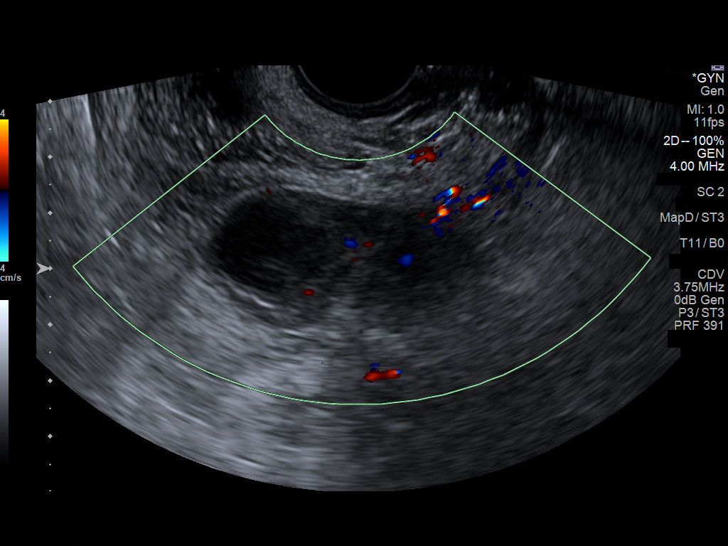
[im 72/79]
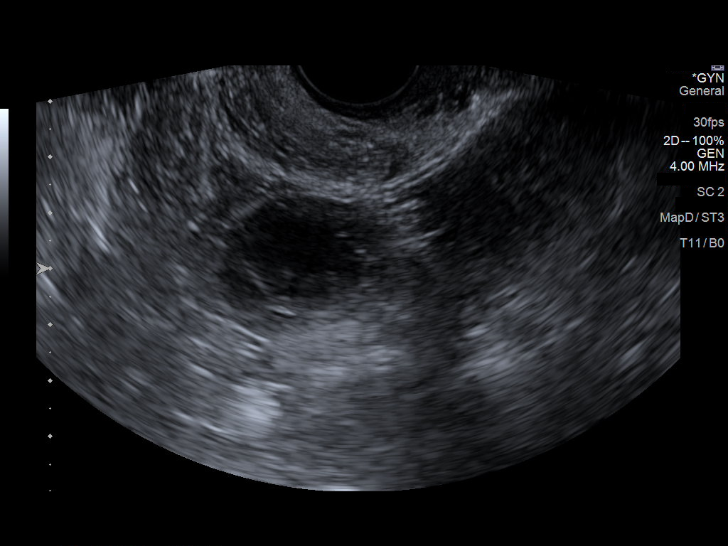
[im 79/79]
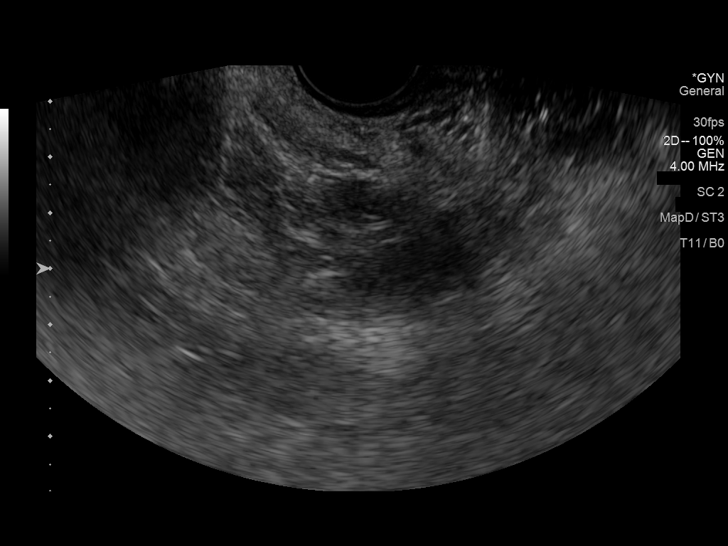

[13 of 25 positions shown; findings below may reference images not displayed]

FINDINGS: Uterus

Measurements: 5.3 x 5.1 x 5.6 cm = volume: 137 mL. Anteverted.
Anterior wall Caesarean section scar per questionable small anterior
wall subserosal leiomyoma 13 mm diameter versus area of deformity
related to adjacent Caesarean section scar. No additional uterine
mass.

Endometrium

Thickness: 9 mm.  No endometrial fluid or focal abnormality

Right ovary

Measurements: 4.9 x 2.4 x 2.6 cm = volume: 16.4 mL. Normal
morphology without mass

Left ovary

Not visualized, likely obscured by bowel

Other findings

No free pelvic fluid.  No adnexal masses.
IMPRESSION: Nonvisualization of LEFT ovary.

Questionable small subserosal leiomyoma at anterior wall of uterus
versus area of deformity related to adjacent Caesarean section scar.

Remainder of exam unremarkable.

## 2023-03-16 ENCOUNTER — Other Ambulatory Visit: Payer: Self-pay | Admitting: Family Medicine

## 2023-03-16 DIAGNOSIS — E1169 Type 2 diabetes mellitus with other specified complication: Secondary | ICD-10-CM

## 2023-03-24 ENCOUNTER — Ambulatory Visit: Payer: BC Managed Care – PPO | Admitting: Family Medicine

## 2023-03-25 ENCOUNTER — Ambulatory Visit: Payer: BC Managed Care – PPO | Admitting: Family Medicine

## 2023-03-25 ENCOUNTER — Encounter: Payer: Self-pay | Admitting: Family Medicine

## 2023-03-25 VITALS — BP 130/88 | HR 77 | Temp 97.9°F | Resp 18 | Ht 60.0 in | Wt 189.4 lb

## 2023-03-25 DIAGNOSIS — Z7984 Long term (current) use of oral hypoglycemic drugs: Secondary | ICD-10-CM | POA: Diagnosis not present

## 2023-03-25 DIAGNOSIS — E119 Type 2 diabetes mellitus without complications: Secondary | ICD-10-CM | POA: Diagnosis not present

## 2023-03-25 DIAGNOSIS — I10 Essential (primary) hypertension: Secondary | ICD-10-CM | POA: Diagnosis not present

## 2023-03-25 LAB — BASIC METABOLIC PANEL
BUN: 7 mg/dL (ref 6–23)
CO2: 27 mEq/L (ref 19–32)
Calcium: 9.4 mg/dL (ref 8.4–10.5)
Chloride: 100 mEq/L (ref 96–112)
Creatinine, Ser: 0.48 mg/dL (ref 0.40–1.20)
GFR: 125.04 mL/min (ref >60.00)
Glucose, Bld: 179 mg/dL — ABNORMAL HIGH (ref 70–99)
Potassium: 4.2 mEq/L (ref 3.5–5.1)
Sodium: 138 mEq/L (ref 135–145)

## 2023-03-25 LAB — HEMOGLOBIN A1C: Hgb A1c MFr Bld: 7.7 % — ABNORMAL HIGH (ref 4.6–6.5)

## 2023-03-25 MED ORDER — ENALAPRIL MALEATE 2.5 MG PO TABS: 2.5000 mg | ORAL_TABLET | Freq: Every day | ORAL | 1 refills | Status: DC

## 2023-03-25 MED ORDER — TIRZEPATIDE 2.5 MG/0.5ML ~~LOC~~ SOAJ: 2.5000 mg | SUBCUTANEOUS | 0 refills | Status: DC

## 2023-03-25 MED ORDER — TIRZEPATIDE 5 MG/0.5ML ~~LOC~~ SOAJ: 5.0000 mg | SUBCUTANEOUS | 2 refills | Status: DC

## 2023-03-25 NOTE — Progress Notes (Signed)
   Subjective:    Patient ID: Gabrielle Edwards, female    DOB: [birth year], 32 y.o.   MRN: [id number]  HPI DM- chronic problem, on Jardiance 10mg  daily, Januvia 100mg  daily, Metformin XR daily.  Last A1C 9.4%  UTD on foot exam, microalbumin.  Due for eye exam- pt has to wait til November due to new insurance.  Pt reports burning w/ urination as the day goes on.  Was told by nurse at work that this is due to the [place].  No CP, SOB, HA's, visual changes, abd pain, N/V.  Denies numbness/tingling of hands/feet   Review of Systems For ROS see HPI     Objective:   Physical Exam Vitals reviewed.  Constitutional:      General: She is not in acute distress.    Appearance: Normal appearance. She is well-developed. She is obese. She is not ill-appearing.  HENT:     Head: Normocephalic and atraumatic.  Eyes:     Conjunctiva/sclera: Conjunctivae normal.     Pupils: Pupils are equal, round, and reactive to light.  Neck:     Thyroid: No thyromegaly.  Cardiovascular:     Rate and Rhythm: Normal rate and regular rhythm.     Pulses: Normal pulses.     Heart sounds: Normal heart sounds. No murmur heard. Pulmonary:     Effort: Pulmonary effort is normal. No respiratory distress.     Breath sounds: Normal breath sounds.  Abdominal:     General: There is no distension.     Palpations: Abdomen is soft.     Tenderness: There is no abdominal tenderness.  Musculoskeletal:     Cervical back: Normal range of motion and neck supple.     Right lower leg: No edema.     Left lower leg: No edema.  Lymphadenopathy:     Cervical: No cervical adenopathy.  Skin:    General: Skin is warm and dry.  Neurological:     General: No focal deficit present.     Mental Status: She is alert and oriented to person, place, and time.  Psychiatric:        Mood and Affect: Mood normal.        Behavior: Behavior normal.        Thought Content: Thought content normal.           Assessment & Plan:

## 2023-03-25 NOTE — Patient Instructions (Signed)
Follow up in 3 months to recheck BP, cholesterol, diabetes We'll notify you of your lab results and make any changes if needed STOP the Jardiance START the [facility] once it is approved.  Start with the 2.5mg  weekly x4 weeks and then increase to 5mg  weekly Once you have the Mounjaro, STOP the Januvia CONTINUE the Metformin Continue to work on healthy diet and regular exercise- you're doing great! Call with any questions or concerns Happy Labor Day!

## 2023-03-25 NOTE — Assessment & Plan Note (Signed)
Enalapril directions changed to once daily at pt's request

## 2023-03-25 NOTE — Assessment & Plan Note (Signed)
Ongoing issue for pt.  Last A1C was 9.4%.  She is having burning w/ urination on the Jardiance so we will stop this medication.  We will start [facility]- pending insurance approval.  Until she has the mounjaro in hand, will continue Januvia and Metformin.  Once she starts Mounjaro, needs to stop Januvia.  Encouraged her to schedule eye exam.  Discussed low carb diet and regular exercise.  Will continue to follow closely.

## 2023-03-26 ENCOUNTER — Telehealth: Payer: Self-pay

## 2023-03-26 NOTE — Telephone Encounter (Signed)
-----   Message from Neena Rhymes sent at 03/26/2023  7:11 AM EDT ----- A1C looks SOOOOO much better!  It is down from 9.4 --> 7.7%  this is awesome!  Keep up the good work on a low carb/low sugar diet and regular exercise.

## 2023-03-26 NOTE — Telephone Encounter (Signed)
Pt seen results Via my chart  

## 2023-04-16 ENCOUNTER — Other Ambulatory Visit: Payer: Self-pay | Admitting: Family Medicine

## 2023-04-17 NOTE — Telephone Encounter (Signed)
Caller name: Gabrielle Edwards  On DPR?: Yes  Call back number: [phone] (mobile)  Provider they see: Sheliah Hatch, MD  Reason for call:   Pt just called stating she's actually already done the 4 weeks of 2.5mg  of Mounjaro and would like 5mg 

## 2023-04-17 NOTE — Telephone Encounter (Signed)
Prescription sent for 5mg  dose

## 2023-04-17 NOTE — Telephone Encounter (Signed)
Pt is requesting 5 mg dose

## 2023-06-11 ENCOUNTER — Encounter: Payer: Self-pay | Admitting: Family Medicine

## 2023-06-11 NOTE — Telephone Encounter (Signed)
Care team updated and letter sent for eye exam notes.

## 2023-06-23 ENCOUNTER — Telehealth: Payer: Self-pay

## 2023-06-23 ENCOUNTER — Encounter: Payer: Self-pay | Admitting: Family Medicine

## 2023-06-23 ENCOUNTER — Ambulatory Visit: Payer: BC Managed Care – PPO | Admitting: Family Medicine

## 2023-06-23 VITALS — BP 122/74 | HR 88 | Temp 97.8°F | Ht 60.0 in | Wt 188.5 lb

## 2023-06-23 DIAGNOSIS — Z7984 Long term (current) use of oral hypoglycemic drugs: Secondary | ICD-10-CM | POA: Diagnosis not present

## 2023-06-23 DIAGNOSIS — E785 Hyperlipidemia, unspecified: Secondary | ICD-10-CM

## 2023-06-23 DIAGNOSIS — I10 Essential (primary) hypertension: Secondary | ICD-10-CM

## 2023-06-23 DIAGNOSIS — E119 Type 2 diabetes mellitus without complications: Secondary | ICD-10-CM

## 2023-06-23 DIAGNOSIS — E1169 Type 2 diabetes mellitus with other specified complication: Secondary | ICD-10-CM

## 2023-06-23 LAB — LIPID PANEL
Cholesterol: 163 mg/dL (ref 0–200)
HDL: 51.5 mg/dL (ref >39.00)
LDL Cholesterol: 82 mg/dL (ref 0–99)
NonHDL: 111.45
Total CHOL/HDL Ratio: 3
Triglycerides: 146 mg/dL (ref 0.0–149.0)
VLDL: 29.2 mg/dL (ref 0.0–40.0)

## 2023-06-23 LAB — HEPATIC FUNCTION PANEL
ALT: 26 U/L (ref 0–35)
AST: 20 U/L (ref 0–37)
Albumin: 4.4 g/dL (ref 3.5–5.2)
Alkaline Phosphatase: 91 U/L (ref 39–117)
Bilirubin, Direct: 0.1 mg/dL (ref 0.0–0.3)
Total Bilirubin: 0.4 mg/dL (ref 0.2–1.2)
Total Protein: 7.7 g/dL (ref 6.0–8.3)

## 2023-06-23 LAB — CBC WITH DIFFERENTIAL/PLATELET
Basophils Absolute: 0 10*3/uL (ref 0.0–0.1)
Basophils Relative: 0.4 % (ref 0.0–3.0)
Eosinophils Absolute: 0.4 10*3/uL (ref 0.0–0.7)
Eosinophils Relative: 3.7 % (ref 0.0–5.0)
HCT: 36.8 % (ref 36.0–46.0)
Hemoglobin: 11.8 g/dL — ABNORMAL LOW (ref 12.0–15.0)
Lymphocytes Relative: 33.5 % (ref 12.0–46.0)
Lymphs Abs: 3.8 10*3/uL (ref 0.7–4.0)
MCHC: 32.1 g/dL (ref 30.0–36.0)
MCV: 79 fL (ref 78.0–100.0)
Monocytes Absolute: 0.5 10*3/uL (ref 0.1–1.0)
Monocytes Relative: 4.8 % (ref 3.0–12.0)
Neutro Abs: 6.6 10*3/uL (ref 1.4–7.7)
Neutrophils Relative %: 57.6 % (ref 43.0–77.0)
Platelets: 342 10*3/uL (ref 150.0–400.0)
RBC: 4.65 Mil/uL (ref 3.87–5.11)
RDW: 15.2 % (ref 11.5–15.5)
WBC: 11.4 10*3/uL — ABNORMAL HIGH (ref 4.0–10.5)

## 2023-06-23 LAB — BASIC METABOLIC PANEL
BUN: 7 mg/dL (ref 6–23)
CO2: 28 meq/L (ref 19–32)
Calcium: 9.7 mg/dL (ref 8.4–10.5)
Chloride: 101 meq/L (ref 96–112)
Creatinine, Ser: 0.5 mg/dL (ref 0.40–1.20)
GFR: 123.6 mL/min (ref >60.00)
Glucose, Bld: 126 mg/dL — ABNORMAL HIGH (ref 70–99)
Potassium: 4.2 meq/L (ref 3.5–5.1)
Sodium: 138 meq/L (ref 135–145)

## 2023-06-23 LAB — TSH: TSH: 2.36 u[IU]/mL (ref 0.35–5.50)

## 2023-06-23 LAB — HEMOGLOBIN A1C: Hgb A1c MFr Bld: 6.9 % — ABNORMAL HIGH (ref 4.6–6.5)

## 2023-06-23 NOTE — Assessment & Plan Note (Signed)
Chronic problem.  Currently on Pravastatin 40mg  daily w/o difficulty.  Check labs.  Adjust meds prn

## 2023-06-23 NOTE — Telephone Encounter (Signed)
Pt has been notified via MyChart.

## 2023-06-23 NOTE — Patient Instructions (Signed)
Follow up in 3-4 months to recheck sugars We'll notify you of your lab results and make any changes if needed Try and eat smaller amounts throughout the day rather than 1 big meal Drink LOTS of fluids You can add Miralax as needed for constipation Continue to work on healthy diet and regular exercise- you can do it! Call with any questions or concerns Stay Safe!  Stay Healthy! Happy Holidays!!

## 2023-06-23 NOTE — Telephone Encounter (Signed)
-----   Message from Neena Rhymes sent at 06/23/2023  3:57 PM EST ----- Labs look great!  No changes at this time

## 2023-06-23 NOTE — Assessment & Plan Note (Signed)
Chronic problem.  Currently well controlled on Enalapril 2.5mg  daily.  Asymptomatic.  ACE will provide renal protection.  Check labs but no anticipated med changes.  Will follow.

## 2023-06-23 NOTE — Progress Notes (Signed)
   Subjective:    Patient ID: Gabrielle Edwards, female    DOB: Feb 22, 1991, 32 y.o.   MRN: [id number]  HPI DM- chronic problem, on Jardiance 10mg  daily, Metformin 500mg  daily, Mounjaro 5mg  weekly.  Eye exam scheduled for January.  UTD on foot exam, microalbumin.  Pt reports most CBGs are below 200.  Pt reports for the few days after her injection she has no appetite and 'only eats 1 proper meal'.  + constipation.  Has added fiber gummies.  Has some symptomatic lows when she doesn't eat.  HTN- chronic problem, on Enalapril 2.5mg  daily.  Well controlled today.  Denies CP, SOB, HA's ,visual changes, edema.  Hyperlipidemia- chronic problem, on Pravastatin 40mg  daily.  + constipation and bloating since starting Mounjaro.  Denies abd pain.   Review of Systems For ROS see HPI     Objective:   Physical Exam Vitals reviewed.  Constitutional:      General: She is not in acute distress.    Appearance: Normal appearance. She is well-developed. She is obese. She is not ill-appearing.  HENT:     Head: Normocephalic and atraumatic.  Eyes:     Conjunctiva/sclera: Conjunctivae normal.     Pupils: Pupils are equal, round, and reactive to light.  Neck:     Thyroid: No thyromegaly.  Cardiovascular:     Rate and Rhythm: Normal rate and regular rhythm.     Pulses: Normal pulses.     Heart sounds: Normal heart sounds. No murmur heard. Pulmonary:     Effort: Pulmonary effort is normal. No respiratory distress.     Breath sounds: Normal breath sounds.  Abdominal:     General: There is no distension.     Palpations: Abdomen is soft.     Tenderness: There is no abdominal tenderness.  Musculoskeletal:     Cervical back: Normal range of motion and neck supple.     Right lower leg: No edema.     Left lower leg: No edema.  Lymphadenopathy:     Cervical: No cervical adenopathy.  Skin:    General: Skin is warm and dry.  Neurological:     General: No focal deficit present.     Mental Status: She is  alert and oriented to person, place, and time.  Psychiatric:        Mood and Affect: Mood normal.        Behavior: Behavior normal.        Thought Content: Thought content normal.           Assessment & Plan:

## 2023-06-23 NOTE — Assessment & Plan Note (Signed)
Ongoing issue for pt.  Currently on Jardiance 10mg  daily, Metformin 500mg  daily, and Mounjaro 5mg  weekly.  Stressed need for her to eat smaller amounts more regularly rather than eating 1 meal daily.  Discussed symptomatic tx for constipation (side effect from [facility]).  Encouraged regular exercise.  Offered to decrease Mounjaro dose due to side effects but pt states she would like to stay where she is.  Check labs.  Adjust meds prn

## 2023-08-07 LAB — HM DIABETES EYE EXAM

## 2023-08-19 ENCOUNTER — Emergency Department (HOSPITAL_COMMUNITY)
Admission: EM | Admit: 2023-08-19 | Discharge: 2023-08-20 | Discharge status: Against Medical Advice | Payer: BC Managed Care – PPO | Attending: Emergency Medicine | Admitting: Emergency Medicine

## 2023-08-19 ENCOUNTER — Encounter (HOSPITAL_COMMUNITY): Payer: Self-pay | Admitting: Emergency Medicine

## 2023-08-19 ENCOUNTER — Other Ambulatory Visit: Payer: Self-pay

## 2023-08-19 DIAGNOSIS — Z5329 Procedure and treatment not carried out because of patient's decision for other reasons: Secondary | ICD-10-CM | POA: Diagnosis not present

## 2023-08-19 DIAGNOSIS — R1013 Epigastric pain: Secondary | ICD-10-CM | POA: Insufficient documentation

## 2023-08-19 DIAGNOSIS — R112 Nausea with vomiting, unspecified: Secondary | ICD-10-CM | POA: Insufficient documentation

## 2023-08-19 DIAGNOSIS — R42 Dizziness and giddiness: Secondary | ICD-10-CM

## 2023-08-19 LAB — CBC
HCT: 41.9 % (ref 36.0–46.0)
Hemoglobin: 13.4 g/dL (ref 12.0–15.0)
MCH: 25.4 pg — ABNORMAL LOW (ref 26.0–34.0)
MCHC: 32 g/dL (ref 30.0–36.0)
MCV: 79.4 fL — ABNORMAL LOW (ref 80.0–100.0)
Platelets: 348 10*3/uL (ref 150–400)
RBC: 5.28 MIL/uL — ABNORMAL HIGH (ref 3.87–5.11)
RDW: 13.6 % (ref 11.5–15.5)
WBC: 14.5 10*3/uL — ABNORMAL HIGH (ref 4.0–10.5)
nRBC: 0 % (ref 0.0–0.2)

## 2023-08-19 LAB — LIPASE, BLOOD: Lipase: 28 U/L (ref 11–51)

## 2023-08-19 LAB — COMPREHENSIVE METABOLIC PANEL
ALT: 29 U/L (ref 0–44)
AST: 23 U/L (ref 15–41)
Albumin: 3.8 g/dL (ref 3.5–5.0)
Alkaline Phosphatase: 91 U/L (ref 38–126)
Anion gap: 12 (ref 5–15)
BUN: 10 mg/dL (ref 6–20)
CO2: 22 mmol/L (ref 22–32)
Calcium: 8.8 mg/dL — ABNORMAL LOW (ref 8.9–10.3)
Chloride: 102 mmol/L (ref 98–111)
Creatinine, Ser: 0.51 mg/dL (ref 0.44–1.00)
GFR, Estimated: >60 mL/min (ref >60)
Glucose, Bld: 185 mg/dL — ABNORMAL HIGH (ref 70–99)
Potassium: 3.7 mmol/L (ref 3.5–5.1)
Sodium: 136 mmol/L (ref 135–145)
Total Bilirubin: 0.4 mg/dL (ref 0.0–1.2)
Total Protein: 7.9 g/dL (ref 6.5–8.1)

## 2023-08-19 LAB — HCG, SERUM, QUALITATIVE: Preg, Serum: NEGATIVE

## 2023-08-19 NOTE — ED Triage Notes (Signed)
Pt here for dizziness, vomiting, epigastric pain since 8:30. Hx of liver disease and T2 DM.

## 2023-08-20 MED ORDER — METOCLOPRAMIDE HCL 10 MG PO TABS
10.0000 mg | ORAL_TABLET | Freq: Once | ORAL | Status: AC
  Administered 2023-08-20: 10 mg via ORAL
  Filled 2023-08-20: qty 1

## 2023-08-20 MED ORDER — ALUM & MAG HYDROXIDE-SIMETH 200-200-20 MG/5ML PO SUSP
30.0000 mL | Freq: Once | ORAL | Status: AC
  Administered 2023-08-20: 30 mL via ORAL
  Filled 2023-08-20: qty 30

## 2023-08-20 MED ORDER — ONDANSETRON 4 MG PO TBDP: ORAL_TABLET | ORAL | 0 refills | Status: DC

## 2023-08-20 NOTE — Discharge Instructions (Addendum)
Try pepcid or tagamet up to twice a day.  Try to avoid things that may make this worse, most commonly these are spicy foods tomato based products fatty foods chocolate and peppermint.  Alcohol and tobacco can also make this worse.  Return to the emergency department for sudden worsening pain fever or inability to eat or drink.  

## 2023-08-20 NOTE — ED Provider Notes (Signed)
[facility] AT [facility] Provider Note   CSN: [id number] Arrival date & time: 08/19/23  2058     History  Chief Complaint  Patient presents with   Abdominal Pain    Gabrielle Edwards is a 33 y.o. female.  33 yo F with a cc of dizziness nausea and vomiting and epigastric abdominal pain.  She thinks it started with a sensation of dizziness when she got up and it made her sick to her stomach and she vomited.  After which she had developed some burning to her epigastrium recurrent belching.  She tried to take her home medicines but threw them up.  She has been able to drink since then.  She was worried because she has a history of LFT elevation thought to be secondary to her diabetes medication.   Abdominal Pain      Home Medications Prior to Admission medications   Medication Sig Start Date End Date Taking? Authorizing Provider  ondansetron (ZOFRAN-ODT) 4 MG disintegrating tablet 4mg  ODT q4 hours prn nausea/vomit 08/20/23  Yes Melene Plan, DO  acetaminophen (TYLENOL) 500 MG tablet Take 1,000 mg by mouth every 6 (six) hours as needed for mild pain or headache.    [provider]  Blood Glucose Monitoring Suppl (ACCU-CHEK AVIVA PLUS) w/Device KIT 1 Device by Does not apply route as directed. 11/29/15   Katrinka Blazing, [place], CNM  empagliflozin (JARDIANCE) 10 MG TABS tablet Take 1 tablet (10 mg total) by mouth daily before breakfast. 12/18/22   Sheliah Hatch, MD  enalapril (VASOTEC) 2.5 MG tablet Take 1 tablet (2.5 mg total) by mouth daily. 03/25/23   Sheliah Hatch, MD  glucose blood (RELION PREMIER TEST) test strip Use as instructed 11/18/19   Royal Hawthorn, NP  ibuprofen (ADVIL,MOTRIN) 800 MG tablet Take 1 tablet (800 mg total) by mouth every 8 (eight) hours as needed. 01/28/16   Thressa Sheller D, CNM  metFORMIN (GLUCOPHAGE-XR) 500 MG 24 hr tablet TAKE 1 TABLET BY MOUTH EVERY DAY WITH BREAKFAST 12/16/22   Sheliah Hatch, MD  pantoprazole  (PROTONIX) 40 MG tablet TAKE 1 TABLET BY MOUTH EVERY DAY 02/24/23   Sheliah Hatch, MD  pravastatin (PRAVACHOL) 40 MG tablet TAKE 1 TABLET BY MOUTH EVERY DAY 03/17/23   Sheliah Hatch, MD  tirzepatide [facility]) 5 MG/0.5ML Pen Inject 5 mg into the skin once a week. Patient not taking: Reported on 06/23/2023 04/17/23   Sheliah Hatch, MD      Allergies    Farxiga [dapagliflozin] and Metformin and related    Review of Systems   Review of Systems  Gastrointestinal:  Positive for abdominal pain.    Physical Exam Updated Vital Signs BP 134/89   Pulse 98   Temp 97.6 F (36.4 C)   Resp 16   Ht 5' (1.524 m)   Wt 85.7 kg   SpO2 99%   BMI 36.91 kg/m  Physical Exam Vitals and nursing note reviewed.  Constitutional:      General: She is not in acute distress.    Appearance: She is well-developed. She is not diaphoretic.  HENT:     Head: Normocephalic and atraumatic.  Eyes:     Pupils: Pupils are equal, round, and reactive to light.  Cardiovascular:     Rate and Rhythm: Normal rate and regular rhythm.     Heart sounds: No murmur heard.    No friction rub. No gallop.  Pulmonary:     Effort:  Pulmonary effort is normal.     Breath sounds: No wheezing or rales.  Abdominal:     General: There is no distension.     Palpations: Abdomen is soft.     Tenderness: There is no abdominal tenderness.     Comments: Benign abdominal exam  Musculoskeletal:        General: No tenderness.     Cervical back: Normal range of motion and neck supple.  Skin:    General: Skin is warm and dry.  Neurological:     Mental Status: She is alert and oriented to person, place, and time.     GCS: GCS eye subscore is 4. GCS verbal subscore is 5. GCS motor subscore is 6.     Cranial Nerves: Cranial nerves 2-12 are intact.     Sensory: Sensation is intact.     Motor: Motor function is intact.     Coordination: Coordination is intact.     Comments: Benign neurologic exam.  Patient is able to  ambulate without issue.  Able to turn rapidly without dizziness.  Psychiatric:        Behavior: Behavior normal.     ED Results / Procedures / Treatments   Labs (all labs ordered are listed, but only abnormal results are displayed) Labs Reviewed  COMPREHENSIVE METABOLIC PANEL - Abnormal; Notable for the following components:      Result Value   Glucose, Bld 185 (*)    Calcium 8.8 (*)    All other components within normal limits  CBC - Abnormal; Notable for the following components:   WBC 14.5 (*)    RBC 5.28 (*)    MCV 79.4 (*)    MCH 25.4 (*)    All other components within normal limits  LIPASE, BLOOD  HCG, SERUM, QUALITATIVE  URINALYSIS, ROUTINE W REFLEX MICROSCOPIC    EKG None  Radiology No results found.  Procedures Procedures    Medications Ordered in ED Medications  metoCLOPramide (REGLAN) tablet 10 mg (has no administration in time range)  alum & mag hydroxide-simeth (MAALOX/MYLANTA) 200-200-20 MG/5ML suspension 30 mL (has no administration in time range)    ED Course/ Medical Decision Making/ A&P                                 Medical Decision Making Amount and/or Complexity of Data Reviewed Labs: ordered.  Risk OTC drugs. Prescription drug management.   33 yo F with a chief complaints of epigastric discomfort.  Based on the patient's timeline she first experienced dizziness then nausea vomiting then epigastric pain.  Suspect patient has likely epigastric discomfort due to vomiting.  Her dizziness seems to have improved.  Her nausea is also improved.  She has been able to drink without too much issue.  I did offer IV fluids and IV antiemetics but she is declining.  Will give an oral dose of Reglan here.  Prescription of Zofran.  GI cocktail.  PCP follow-up.  Mild leukocytosis.  No LFT elevation lipase is normal.  No significant electrolyte abnormalities.  Blood sugar is mildly elevated at 185.  3:16 AM:  I have discussed the  diagnosis/risks/treatment options with the patient.  Evaluation and diagnostic testing in the emergency department does not suggest an emergent condition requiring admission or immediate intervention beyond what has been performed at this time.  They will follow up with PCP. We also discussed returning to the ED immediately if  new or worsening sx occur. We discussed the sx which are most concerning (e.g., sudden worsening pain, fever, inability to tolerate by mouth) that necessitate immediate return. Medications administered to the patient during their visit and any new prescriptions provided to the patient are listed below.  Medications given during this visit Medications  metoCLOPramide (REGLAN) tablet 10 mg (has no administration in time range)  alum & mag hydroxide-simeth (MAALOX/MYLANTA) 200-200-20 MG/5ML suspension 30 mL (has no administration in time range)     The patient appears reasonably screen and/or stabilized for discharge and I doubt any other medical condition or other [facility] requiring further screening, evaluation, or treatment in the ED at this time prior to discharge.          Final Clinical Impression(s) / ED Diagnoses Final diagnoses:  None    Rx / DC Orders ED Discharge Orders          Ordered    ondansetron (ZOFRAN-ODT) 4 MG disintegrating tablet        08/20/23 0313              Melene Plan, DO 08/20/23 [phone]

## 2023-08-20 NOTE — ED Notes (Signed)
Assumed care of pt, found her sitting in a chair ready to be discharged.  Pt indicated that she started feeling poorly on Tuesday night and then had three episodes of emesis.

## 2023-09-24 ENCOUNTER — Other Ambulatory Visit: Payer: Self-pay | Admitting: Family Medicine

## 2023-09-24 DIAGNOSIS — E1169 Type 2 diabetes mellitus with other specified complication: Secondary | ICD-10-CM

## 2023-09-24 DIAGNOSIS — R1013 Epigastric pain: Secondary | ICD-10-CM

## 2023-09-24 DIAGNOSIS — I10 Essential (primary) hypertension: Secondary | ICD-10-CM

## 2023-09-25 ENCOUNTER — Ambulatory Visit: Payer: BC Managed Care – PPO | Admitting: Family Medicine

## 2023-09-25 VITALS — BP 110/72 | Temp 94.0°F | Ht 60.0 in | Wt 192.1 lb

## 2023-09-25 DIAGNOSIS — E119 Type 2 diabetes mellitus without complications: Secondary | ICD-10-CM | POA: Diagnosis not present

## 2023-09-25 LAB — HEPATIC FUNCTION PANEL
ALT: 31 U/L (ref 0–35)
AST: 28 U/L (ref 0–37)
Albumin: 4.5 g/dL (ref 3.5–5.2)
Alkaline Phosphatase: 94 U/L (ref 39–117)
Bilirubin, Direct: 0.1 mg/dL (ref 0.0–0.3)
Total Bilirubin: 0.4 mg/dL (ref 0.2–1.2)
Total Protein: 8.2 g/dL (ref 6.0–8.3)

## 2023-09-25 LAB — BASIC METABOLIC PANEL
BUN: 9 mg/dL (ref 6–23)
CO2: 28 meq/L (ref 19–32)
Calcium: 9.9 mg/dL (ref 8.4–10.5)
Chloride: 99 meq/L (ref 96–112)
Creatinine, Ser: 0.54 mg/dL (ref 0.40–1.20)
GFR: 121.11 mL/min (ref >60.00)
Glucose, Bld: 137 mg/dL — ABNORMAL HIGH (ref 70–99)
Potassium: 3.9 meq/L (ref 3.5–5.1)
Sodium: 138 meq/L (ref 135–145)

## 2023-09-25 LAB — MICROALBUMIN / CREATININE URINE RATIO
Creatinine,U: 145.6 mg/dL
Microalb Creat Ratio: 188.7 mg/g — ABNORMAL HIGH (ref 0.0–30.0)
Microalb, Ur: 27.5 mg/dL — ABNORMAL HIGH (ref 0.0–1.9)

## 2023-09-25 LAB — HEMOGLOBIN A1C: Hgb A1c MFr Bld: 7.2 % — ABNORMAL HIGH (ref 4.6–6.5)

## 2023-09-25 NOTE — Progress Notes (Signed)
   Subjective:    Patient ID: Gabrielle Edwards, female    DOB: 07/19/1991, 33 y.o.   MRN: [id number]  HPI DM- chronic problem.  Currently on Metformin.  Went to ER in January due to vomiting/abdominal pain/constipation.  Currently on Mounjaro 5mg  weekly.  Reports she will feel very bloated for the first 4 days after injection.  Has to take Miralax regularly.  UTD on eye exam.  Due for foot exam, microalbumin.  Currently on Enalapril.  Pt reports Mounjaro is helping A1C and is controlling appetite but slow bowels are causing issues.  No CP, SOB, HA's, visual changes.   Review of Systems For ROS see HPI     Objective:   Physical Exam Vitals reviewed.  Constitutional:      General: She is not in acute distress.    Appearance: Normal appearance. She is well-developed. She is obese. She is not ill-appearing.  HENT:     Head: Normocephalic and atraumatic.  Eyes:     Conjunctiva/sclera: Conjunctivae normal.     Pupils: Pupils are equal, round, and reactive to light.  Neck:     Thyroid: No thyromegaly.  Cardiovascular:     Rate and Rhythm: Normal rate and regular rhythm.     Heart sounds: Normal heart sounds. No murmur heard. Pulmonary:     Effort: Pulmonary effort is normal. No respiratory distress.     Breath sounds: Normal breath sounds.  Abdominal:     General: There is no distension.     Palpations: Abdomen is soft.     Tenderness: There is no abdominal tenderness.  Musculoskeletal:     Cervical back: Normal range of motion and neck supple.  Lymphadenopathy:     Cervical: No cervical adenopathy.  Skin:    General: Skin is warm and dry.  Neurological:     Mental Status: She is alert and oriented to person, place, and time.  Psychiatric:        Mood and Affect: Mood normal.        Behavior: Behavior normal.           Assessment & Plan:

## 2023-09-25 NOTE — Patient Instructions (Signed)
 Schedule your complete physical in 3 months We'll notify you of your lab results and make any changes if needed Make sure you are drinking LOTS of water to help move your bowels ADD a daily Magnesium Citrate supplement (available OTC in pills or gummy form) Regular physical activity can also help move bowels Message me in 2-3 weeks and let me know how things are going Call with any questions or concerns Hang in there!

## 2023-09-25 NOTE — Assessment & Plan Note (Signed)
 Chronic problem.  Pt is not happy w/ the bloating and constipation caused by the [facility] but she does not want to switch medications at this time.  We talked about management of constipation w/ magnesium supplements, increased water intake, physical activity.  She is to let me know in a few weeks if this has been helpful or if she would like to make a change.  UTD on eye exam.  Foot exam done today.  Microalbumin ordered.  Check labs.  Adjust meds prn

## 2023-09-26 ENCOUNTER — Encounter: Payer: Self-pay | Admitting: Family Medicine

## 2023-09-26 ENCOUNTER — Telehealth: Payer: Self-pay

## 2023-09-26 DIAGNOSIS — R1013 Epigastric pain: Secondary | ICD-10-CM

## 2023-09-26 DIAGNOSIS — E1169 Type 2 diabetes mellitus with other specified complication: Secondary | ICD-10-CM

## 2023-09-26 DIAGNOSIS — E785 Hyperlipidemia, unspecified: Secondary | ICD-10-CM

## 2023-09-26 DIAGNOSIS — I10 Essential (primary) hypertension: Secondary | ICD-10-CM

## 2023-09-26 MED ORDER — PRAVASTATIN SODIUM 40 MG PO TABS: 40.0000 mg | ORAL_TABLET | Freq: Every day | ORAL | 1 refills | Status: AC

## 2023-09-26 MED ORDER — ENALAPRIL MALEATE 2.5 MG PO TABS: 2.5000 mg | ORAL_TABLET | Freq: Every day | ORAL | 1 refills | Status: DC

## 2023-09-26 MED ORDER — PANTOPRAZOLE SODIUM 40 MG PO TBEC: 40.0000 mg | DELAYED_RELEASE_TABLET | Freq: Every day | ORAL | 1 refills | Status: DC

## 2023-09-26 MED ORDER — METFORMIN HCL ER 500 MG PO TB24: ORAL_TABLET | ORAL | 1 refills | Status: DC

## 2023-09-26 NOTE — Telephone Encounter (Signed)
 Copied from CRM [id number]. Topic: Clinical - Medication Question >> Sep 26, 2023 12:31 PM Taleah C wrote: Reason for CRM: pt called to let Dr. Beverely Low know that she has switched her medications [facility] to [facility]. She wanted to give her a heads up to let her know that [facility] will be reaching out for approval from her. Please advise.

## 2023-10-01 ENCOUNTER — Other Ambulatory Visit: Payer: Self-pay | Admitting: Family Medicine

## 2023-10-01 ENCOUNTER — Telehealth: Payer: Self-pay

## 2023-10-01 ENCOUNTER — Other Ambulatory Visit: Payer: Self-pay

## 2023-10-01 DIAGNOSIS — I10 Essential (primary) hypertension: Secondary | ICD-10-CM

## 2023-10-01 MED ORDER — ENALAPRIL MALEATE 5 MG PO TABS: 5.0000 mg | ORAL_TABLET | Freq: Every day | ORAL | 1 refills | Status: DC

## 2023-10-01 NOTE — Telephone Encounter (Signed)
 Last Fill: 04/17/23  Last OV: 09/25/23 Next OV: 12/25/23  Routing to provider for review/authorization.

## 2023-10-01 NOTE — Telephone Encounter (Signed)
 Copied from CRM [id number]. Topic: Clinical - Prescription Issue >> Oct 01, 2023  4:07 PM Gabrielle Edwards wrote: Reason for CRM: pt called to request a increase to 5mg  pt is currently getting  enalapril (VASOTEC) 2.5 MG tablet. Pt is requesting the provider change the medication and send over a new prescription. Please call pt back at [phone]

## 2023-10-01 NOTE — Telephone Encounter (Signed)
 Copied from CRM [id number]. Topic: Clinical - Medication Refill >> Oct 01, 2023  4:04 PM Sonny Dandy B wrote: Most Recent Primary Care Visit:  Provider: Sheliah Hatch  Department: [facility]  Visit Type: OFFICE VISIT  Date: 09/25/2023  Medication: tirzepatide [facility]) 5 MG/0.5ML Pen  Has the patient contacted their pharmacy? Yes (Agent: If no, request that the patient contact the pharmacy for the refill. If patient does not wish to contact the pharmacy document the reason why and proceed with request.) (Agent: If yes, when and what did the pharmacy advise?)  Is this the correct pharmacy for this prescription? Yes If no, delete pharmacy and type the correct one.  This is the patient's preferred pharmacy:  [facility] [place] [place] - [address] [place] [address] Phone: [phone] Fax: [phone]   Has the prescription been filled recently? Yes  Is the patient out of the medication? Yes  Has the patient been seen for an appointment in the last year OR does the patient have an upcoming appointment? Yes  Can we respond through MyChart? Yes  Agent: Please be advised that Rx refills may take up to 3 business days. We ask that you follow-up with your pharmacy.

## 2023-10-02 MED ORDER — TIRZEPATIDE 5 MG/0.5ML ~~LOC~~ SOAJ: 5.0000 mg | SUBCUTANEOUS | 1 refills | Status: DC

## 2023-12-25 ENCOUNTER — Encounter: Payer: BC Managed Care – PPO | Admitting: Family Medicine

## 2024-01-11 ENCOUNTER — Other Ambulatory Visit: Payer: Self-pay | Admitting: Family Medicine

## 2024-01-22 ENCOUNTER — Encounter: Payer: Self-pay | Admitting: Family Medicine

## 2024-01-22 ENCOUNTER — Ambulatory Visit (INDEPENDENT_AMBULATORY_CARE_PROVIDER_SITE_OTHER): Admitting: Family Medicine

## 2024-01-22 VITALS — BP 114/70 | HR 92 | Temp 97.4°F | Wt 188.8 lb

## 2024-01-22 DIAGNOSIS — Z0000 Encounter for general adult medical examination without abnormal findings: Secondary | ICD-10-CM

## 2024-01-22 DIAGNOSIS — R5383 Other fatigue: Secondary | ICD-10-CM | POA: Diagnosis not present

## 2024-01-22 DIAGNOSIS — M7989 Other specified soft tissue disorders: Secondary | ICD-10-CM

## 2024-01-22 DIAGNOSIS — Z7985 Long-term (current) use of injectable non-insulin antidiabetic drugs: Secondary | ICD-10-CM

## 2024-01-22 DIAGNOSIS — Z7984 Long term (current) use of oral hypoglycemic drugs: Secondary | ICD-10-CM

## 2024-01-22 DIAGNOSIS — E119 Type 2 diabetes mellitus without complications: Secondary | ICD-10-CM

## 2024-01-22 NOTE — Progress Notes (Signed)
   Subjective:    Patient ID: Gabrielle Edwards, female    DOB: 01-May-1991, 33 y.o.   MRN: [id number]  HPI CPE- UTD on eye exam, microalbumin, foot exam, pap, Tdap  Patient Care Team    Relationship Specialty Notifications Start End  Mahlon Comer BRAVO, MD PCP - General Family Medicine  05/02/22   Alger Gong, MD Consulting Physician Obstetrics and Gynecology  08/29/22   Robinson Mayo, OD Referring Physician Optometry  06/11/23     Health Maintenance  Topic Date Due   Pneumococcal Vaccine 89-70 Years old (1 of 2 - PCV) Never done   Hepatitis B Vaccines (1 of 3 - 19+ 3-dose series) Never done   HPV VACCINES (1 - 3-dose SCDM series) Never done   COVID-19 Vaccine (4 - 2024-25 season) 02/07/2024 (Originally 03/30/2023)   INFLUENZA VACCINE  02/27/2024   HEMOGLOBIN A1C  03/24/2024   OPHTHALMOLOGY EXAM  08/06/2024   Diabetic kidney evaluation - eGFR measurement  09/24/2024   Diabetic kidney evaluation - Urine ACR  09/24/2024   FOOT EXAM  09/24/2024   DTaP/Tdap/Td (2 - Td or Tdap) 11/08/2025   Cervical Cancer Screening (HPV/Pap Cotest)  09/10/2026   Hepatitis C Screening  Completed   HIV Screening  Completed   Meningococcal B Vaccine  Aged Out      Review of Systems Patient reports no vision/ hearing changes, adenopathy,fever, weight change,  persistant/recurrent hoarseness , swallowing issues, chest pain, palpitations, edema, persistant/recurrent cough, hemoptysis, dyspnea (rest/exertional/paroxysmal nocturnal), gastrointestinal bleeding (melena, rectal bleeding), abdominal pain, significant heartburn, bowel changes, GU symptoms (dysuria, hematuria, incontinence), Gyn symptoms (abnormal  bleeding, pain),  syncope, focal weakness, memory loss, numbness & tingling, skin/hair/nail changes, abnormal bruising or bleeding, anxiety, or depression.     Objective:   Physical Exam General Appearance:    Alert, cooperative, no distress, appears stated age, obese  Head:    Normocephalic, without  obvious abnormality, atraumatic  Eyes:    PERRL, conjunctiva/corneas clear, EOM's intact, fundi    benign, both eyes  Ears:    Normal TM's and external ear canals, both ears  Nose:   Nares normal, septum midline, mucosa normal, no drainage    or sinus tenderness  Throat:   Lips, mucosa, and tongue normal; teeth and gums normal  Neck:   Supple, symmetrical, trachea midline, no adenopathy;    Thyroid : no enlargement/tenderness/nodules  Back:     Symmetric, no curvature, ROM normal, no CVA tenderness  Lungs:     Clear to auscultation bilaterally, respirations unlabored  Chest Wall:    No tenderness or deformity   Heart:    Regular rate and rhythm, S1 and S2 normal, no murmur, rub   or gallop  Breast Exam:    Deferred to GYN  Abdomen:     Soft, non-tender, bowel sounds active all four quadrants,    no masses, no organomegaly  Genitalia:    Deferred to GYN  Rectal:    Extremities:   Extremities normal, atraumatic, no cyanosis or edema, 2 soft tissue mass of R lower leg  Pulses:   2+ and symmetric all extremities  Skin:   Skin color, texture, turgor normal, no rashes or lesions  Lymph nodes:   Cervical, supraclavicular, and axillary nodes normal  Neurologic:   CNII-XII intact, normal strength, sensation and reflexes    throughout          Assessment & Plan:

## 2024-01-22 NOTE — Patient Instructions (Signed)
 Follow up in 3-4 months to recheck diabetes We'll notify you of your lab results and make any changes if needed Keep up the good work on healthy diet and regular exercise- you look great! We'll call you to schedule your surgery consultation Call with any questions or concerns Stay Safe!  Stay Healthy! [place]!!!

## 2024-01-22 NOTE — Assessment & Plan Note (Signed)
 Pt's PE WNL w/ exception of BMI and soft tissue mass of R lower leg.  UTD on pap, Tdap.  Check labs.  Anticipatory guidance provided.

## 2024-01-23 LAB — CBC WITH DIFFERENTIAL/PLATELET
Basophils Absolute: 0.1 10*3/uL (ref 0.0–0.1)
Basophils Relative: 0.9 % (ref 0.0–3.0)
Eosinophils Absolute: 0.3 10*3/uL (ref 0.0–0.7)
Eosinophils Relative: 3.5 % (ref 0.0–5.0)
HCT: 37.2 % (ref 36.0–46.0)
Hemoglobin: 12 g/dL (ref 12.0–15.0)
Lymphocytes Relative: 32.4 % (ref 12.0–46.0)
Lymphs Abs: 2.9 10*3/uL (ref 0.7–4.0)
MCHC: 32.3 g/dL (ref 30.0–36.0)
MCV: 78.2 fl (ref 78.0–100.0)
Monocytes Absolute: 0.2 10*3/uL (ref 0.1–1.0)
Monocytes Relative: 1.9 % — ABNORMAL LOW (ref 3.0–12.0)
Neutro Abs: 5.5 10*3/uL (ref 1.4–7.7)
Neutrophils Relative %: 61.3 % (ref 43.0–77.0)
Platelets: 309 10*3/uL (ref 150.0–400.0)
RBC: 4.76 Mil/uL (ref 3.87–5.11)
RDW: 15.2 % (ref 11.5–15.5)
WBC: 9 10*3/uL (ref 4.0–10.5)

## 2024-01-23 LAB — LIPID PANEL
Cholesterol: 173 mg/dL (ref 0–200)
HDL: 58.9 mg/dL (ref >39.00)
LDL Cholesterol: 96 mg/dL (ref 0–99)
NonHDL: 113.84
Total CHOL/HDL Ratio: 3
Triglycerides: 87 mg/dL (ref 0.0–149.0)
VLDL: 17.4 mg/dL (ref 0.0–40.0)

## 2024-01-23 LAB — VITAMIN D 25 HYDROXY (VIT D DEFICIENCY, FRACTURES): VITD: 19.02 ng/mL — ABNORMAL LOW (ref 30.00–100.00)

## 2024-01-23 LAB — BASIC METABOLIC PANEL WITH GFR
BUN: 6 mg/dL (ref 6–23)
CO2: 28 meq/L (ref 19–32)
Calcium: 9.4 mg/dL (ref 8.4–10.5)
Chloride: 102 meq/L (ref 96–112)
Creatinine, Ser: 0.41 mg/dL (ref 0.40–1.20)
GFR: 129.12 mL/min (ref >60.00)
Glucose, Bld: 117 mg/dL — ABNORMAL HIGH (ref 70–99)
Potassium: 4.1 meq/L (ref 3.5–5.1)
Sodium: 139 meq/L (ref 135–145)

## 2024-01-23 LAB — HEPATIC FUNCTION PANEL
ALT: 47 U/L — ABNORMAL HIGH (ref 0–35)
AST: 21 U/L (ref 0–37)
Albumin: 4.5 g/dL (ref 3.5–5.2)
Alkaline Phosphatase: 91 U/L (ref 39–117)
Bilirubin, Direct: 0.1 mg/dL (ref 0.0–0.3)
Total Bilirubin: 0.4 mg/dL (ref 0.2–1.2)
Total Protein: 7.7 g/dL (ref 6.0–8.3)

## 2024-01-23 LAB — TSH: TSH: 1.65 u[IU]/mL (ref 0.35–5.50)

## 2024-01-23 LAB — B12 AND FOLATE PANEL
Folate: >23.2 ng/mL (ref >5.9)
Vitamin B-12: 571 pg/mL (ref 211–911)

## 2024-01-23 LAB — HEMOGLOBIN A1C: Hgb A1c MFr Bld: 7.3 % — ABNORMAL HIGH (ref 4.6–6.5)

## 2024-01-27 ENCOUNTER — Ambulatory Visit: Payer: Self-pay | Admitting: Family Medicine

## 2024-01-27 ENCOUNTER — Other Ambulatory Visit: Payer: Self-pay

## 2024-01-27 MED ORDER — VITAMIN D (ERGOCALCIFEROL) 1.25 MG (50000 UNIT) PO CAPS: 50000.0000 [IU] | ORAL_CAPSULE | ORAL | 0 refills | Status: DC

## 2024-01-27 NOTE — Telephone Encounter (Signed)
 Pt has reviewed lab results on MyChart Vitamin D has been sent in to pharmacy on file

## 2024-01-27 NOTE — Telephone Encounter (Signed)
-----   Message from Comer Greet sent at 01/27/2024  7:28 AM EDT ----- Vit D level is low.  Based on this, we need to start 50,000 units weekly x12 weeks in addition to daily OTC supplement of at least 2000 units.   A1C is stable- please continue to work on low carb/low sugar diet and regular exercise  Remainder of labs are stable and look good ----- Message ----- From: Interface, Lab In Three Zero One Sent: 01/23/2024   2:08 PM EDT To: Comer FORBES Greet, MD

## 2024-04-03 ENCOUNTER — Other Ambulatory Visit: Payer: Self-pay | Admitting: Family Medicine

## 2024-04-22 ENCOUNTER — Ambulatory Visit: Admitting: Family Medicine

## 2024-04-22 ENCOUNTER — Encounter: Payer: Self-pay | Admitting: Family Medicine

## 2024-04-22 VITALS — BP 122/72 | HR 80 | Temp 98.0°F | Ht 61.0 in | Wt 194.2 lb

## 2024-04-22 DIAGNOSIS — E119 Type 2 diabetes mellitus without complications: Secondary | ICD-10-CM

## 2024-04-22 DIAGNOSIS — R5383 Other fatigue: Secondary | ICD-10-CM

## 2024-04-22 DIAGNOSIS — Z23 Encounter for immunization: Secondary | ICD-10-CM

## 2024-04-22 DIAGNOSIS — Z7985 Long-term (current) use of injectable non-insulin antidiabetic drugs: Secondary | ICD-10-CM

## 2024-04-22 DIAGNOSIS — Z7984 Long term (current) use of oral hypoglycemic drugs: Secondary | ICD-10-CM

## 2024-04-22 LAB — BASIC METABOLIC PANEL WITH GFR
BUN: 11 mg/dL (ref 6–23)
CO2: 25 meq/L (ref 19–32)
Calcium: 10.3 mg/dL (ref 8.4–10.5)
Chloride: 99 meq/L (ref 96–112)
Creatinine, Ser: 0.48 mg/dL (ref 0.40–1.20)
GFR: 124.09 mL/min (ref >60.00)
Glucose, Bld: 215 mg/dL — ABNORMAL HIGH (ref 70–99)
Potassium: 3.9 meq/L (ref 3.5–5.1)
Sodium: 137 meq/L (ref 135–145)

## 2024-04-22 LAB — CBC WITH DIFFERENTIAL/PLATELET
Basophils Absolute: 0.1 K/uL (ref 0.0–0.1)
Basophils Relative: 0.6 % (ref 0.0–3.0)
Eosinophils Absolute: 0.4 K/uL (ref 0.0–0.7)
Eosinophils Relative: 3.9 % (ref 0.0–5.0)
HCT: 39.7 % (ref 36.0–46.0)
Hemoglobin: 12.7 g/dL (ref 12.0–15.0)
Lymphocytes Relative: 33.8 % (ref 12.0–46.0)
Lymphs Abs: 3.8 K/uL (ref 0.7–4.0)
MCHC: 31.9 g/dL (ref 30.0–36.0)
MCV: 79.5 fl (ref 78.0–100.0)
Monocytes Absolute: 0.5 K/uL (ref 0.1–1.0)
Monocytes Relative: 4.1 % (ref 3.0–12.0)
Neutro Abs: 6.6 K/uL (ref 1.4–7.7)
Neutrophils Relative %: 57.6 % (ref 43.0–77.0)
Platelets: 322 K/uL (ref 150.0–400.0)
RBC: 4.99 Mil/uL (ref 3.87–5.11)
RDW: 14.7 % (ref 11.5–15.5)
WBC: 11.4 K/uL — ABNORMAL HIGH (ref 4.0–10.5)

## 2024-04-22 LAB — MICROALBUMIN / CREATININE URINE RATIO
Creatinine,U: 75.8 mg/dL
Microalb Creat Ratio: 422.9 mg/g — ABNORMAL HIGH (ref 0.0–30.0)
Microalb, Ur: 32 mg/dL — ABNORMAL HIGH (ref 0.0–1.9)

## 2024-04-22 LAB — HEPATIC FUNCTION PANEL
ALT: 29 U/L (ref 0–35)
AST: 26 U/L (ref 0–37)
Albumin: 4.5 g/dL (ref 3.5–5.2)
Alkaline Phosphatase: 82 U/L (ref 39–117)
Bilirubin, Direct: 0.1 mg/dL (ref 0.0–0.3)
Total Bilirubin: 0.4 mg/dL (ref 0.2–1.2)
Total Protein: 7.6 g/dL (ref 6.0–8.3)

## 2024-04-22 LAB — B12 AND FOLATE PANEL
Folate: >22.9 ng/mL (ref >5.9)
Vitamin B-12: 499 pg/mL (ref 211–911)

## 2024-04-22 LAB — HEMOGLOBIN A1C: Hgb A1c MFr Bld: 9 % — ABNORMAL HIGH (ref 4.6–6.5)

## 2024-04-22 LAB — TSH: TSH: 5.07 u[IU]/mL (ref 0.35–5.50)

## 2024-04-22 LAB — VITAMIN D 25 HYDROXY (VIT D DEFICIENCY, FRACTURES): VITD: 18.78 ng/mL — ABNORMAL LOW (ref 30.00–100.00)

## 2024-04-22 MED ORDER — TIRZEPATIDE 7.5 MG/0.5ML ~~LOC~~ SOAJ: 7.5000 mg | SUBCUTANEOUS | 1 refills | Status: AC

## 2024-04-22 NOTE — Progress Notes (Signed)
   Subjective:    Patient ID: Gabrielle Edwards, female    DOB: 11-27-1990, 33 y.o.   MRN: [id number]  HPI DM- ongoing issue.  On Mounjaro  5mg  weekly, and Metformin  XR 500mg  daily.  Last A1C 7.3%.  UTD on eye exam, foot exam, microalbumin.  On ACE.  Home CBGs ~200.  Pt reports increased fatigue recently- last 3-4 weeks.  Finds herself napping during the day.  No fever.  No CP, SOB, HA's, visual changes, abd pain, N/V.   Review of Systems For ROS see HPI     Objective:   Physical Exam Vitals reviewed.  Constitutional:      General: She is not in acute distress.    Appearance: Normal appearance. She is well-developed. She is obese. She is not ill-appearing.  HENT:     Head: Normocephalic and atraumatic.  Eyes:     Conjunctiva/sclera: Conjunctivae normal.     Pupils: Pupils are equal, round, and reactive to light.  Neck:     Thyroid : No thyromegaly.  Cardiovascular:     Rate and Rhythm: Normal rate and regular rhythm.     Pulses: Normal pulses.     Heart sounds: Normal heart sounds. No murmur heard. Pulmonary:     Effort: Pulmonary effort is normal. No respiratory distress.     Breath sounds: Normal breath sounds.  Abdominal:     General: There is no distension.     Palpations: Abdomen is soft.     Tenderness: There is no abdominal tenderness.  Musculoskeletal:     Cervical back: Normal range of motion and neck supple.     Right lower leg: No edema.     Left lower leg: No edema.  Lymphadenopathy:     Cervical: No cervical adenopathy.  Skin:    General: Skin is warm and dry.  Neurological:     General: No focal deficit present.     Mental Status: She is alert and oriented to person, place, and time.  Psychiatric:        Mood and Affect: Mood normal.        Behavior: Behavior normal.        Thought Content: Thought content normal.           Assessment & Plan:  Fatigue- deteriorated.  No specific systemic sxs like fevers, sweats, chills, cough, weight loss.   Discussed that this is likely multifactorial- increased sugars, seasonal allergies, increased stress, work burnout.  She admits that she is burned out at work and stress levels are high.  Not to mention global events.  Told her to rest when needed.  Practice self care- regular physical activity, participate in hobbies, take vacation time if available.  Will check labs to r/o underlying cause.  If sxs persist, may need to discuss SSRI or similar medication.  Pt expressed understanding and is in agreement w/ plan.

## 2024-04-22 NOTE — Patient Instructions (Addendum)
 Follow up in 3-4 months to recheck sugar, BP, cholesterol We'll notify you of your lab results and make any changes if needed Continue to work on [employer] and regular exercise- you can do it! INCREASE the Mounjaro  to 7.5mg  weekly Call with any questions or concerns Stay Safe!  Stay Healthy! Take the time you need!!!  Things are hard right now!!

## 2024-04-22 NOTE — Assessment & Plan Note (Signed)
 Chronic problem.  Currently on Mounjaro  5mg  weekly and Metformin  XR.  She reports in the last 3-4 weeks sugars have increased from the 100s --> 200s.  She would like to increase Mounjaro  b/c she also feels that weight is back up and hunger has returned.  UTD on eye exam, foot exam, microalbumin.  On ACE so will repeat micro to see if there's improvement.  Check labs.  Continue to follow closely.

## 2024-04-23 ENCOUNTER — Other Ambulatory Visit: Payer: Self-pay

## 2024-04-23 ENCOUNTER — Ambulatory Visit: Payer: Self-pay | Admitting: Family Medicine

## 2024-04-23 MED ORDER — ENALAPRIL MALEATE 10 MG PO TABS: 10.0000 mg | ORAL_TABLET | Freq: Every day | ORAL | 1 refills | Status: AC

## 2024-04-23 MED ORDER — VITAMIN D (ERGOCALCIFEROL) 1.25 MG (50000 UNIT) PO CAPS: 50000.0000 [IU] | ORAL_CAPSULE | ORAL | 0 refills | Status: AC

## 2024-04-23 NOTE — Progress Notes (Signed)
 Enalapril  10 and Vitamin d  has been sent in Pt has read results via MyChart

## 2024-04-27 ENCOUNTER — Telehealth: Payer: Self-pay

## 2024-04-27 ENCOUNTER — Other Ambulatory Visit (HOSPITAL_COMMUNITY): Payer: Self-pay

## 2024-04-27 NOTE — Telephone Encounter (Signed)
 Pharmacy Patient Advocate Encounter   Received notification from Pt Calls Messages that prior authorization for Mounjaro  7.5MG /0.5ML Auto-injectors is required/requested.   Insurance verification completed.   The patient is insured through [facility] .   Per test claim: PA required; PA submitted to above mentioned insurance via Latent Key/confirmation #/EOC [id number] Status is pending

## 2024-04-27 NOTE — Telephone Encounter (Signed)
 Copied from CRM #[id number]. Topic: Clinical - Medication Prior Auth >> Apr 27, 2024  7:41 AM Suzen RAMAN wrote: Reason for CRM: per patient her order has been cancel and her insurance has stated they needs an prior authorization for tirzepatide  (MOUNJARO ) 7.5 MG/0.5ML Pen.

## 2024-04-27 NOTE — Telephone Encounter (Signed)
 Patient is needing PA.

## 2024-05-05 ENCOUNTER — Other Ambulatory Visit (HOSPITAL_COMMUNITY): Payer: Self-pay

## 2024-05-05 NOTE — Telephone Encounter (Signed)
 Pharmacy Patient Advocate Encounter  Received notification from [employer] that Prior Authorization for Mounjaro  7.5MG /0.5ML Auto-injectors has been DENIED.  Full denial letter will be uploaded to the media tab. See denial reason below.   PA #/Case ID/Reference #: [id number]

## 2024-06-13 ENCOUNTER — Other Ambulatory Visit: Payer: Self-pay | Admitting: Family Medicine

## 2024-06-13 DIAGNOSIS — R1013 Epigastric pain: Secondary | ICD-10-CM

## 2024-06-29 ENCOUNTER — Other Ambulatory Visit: Payer: Self-pay | Admitting: Family Medicine

## 2024-07-27 ENCOUNTER — Encounter: Payer: Self-pay | Admitting: Family Medicine

## 2024-07-27 ENCOUNTER — Ambulatory Visit: Admitting: Family Medicine

## 2024-07-27 VITALS — BP 108/70 | HR 96 | Temp 98.0°F | Resp 20 | Ht 61.0 in | Wt 190.4 lb

## 2024-07-27 DIAGNOSIS — Z7984 Long term (current) use of oral hypoglycemic drugs: Secondary | ICD-10-CM

## 2024-07-27 DIAGNOSIS — E785 Hyperlipidemia, unspecified: Secondary | ICD-10-CM | POA: Diagnosis not present

## 2024-07-27 DIAGNOSIS — I10 Essential (primary) hypertension: Secondary | ICD-10-CM | POA: Diagnosis not present

## 2024-07-27 DIAGNOSIS — Z7985 Long-term (current) use of injectable non-insulin antidiabetic drugs: Secondary | ICD-10-CM

## 2024-07-27 DIAGNOSIS — E119 Type 2 diabetes mellitus without complications: Secondary | ICD-10-CM

## 2024-07-27 DIAGNOSIS — E1169 Type 2 diabetes mellitus with other specified complication: Secondary | ICD-10-CM | POA: Diagnosis not present

## 2024-07-27 LAB — CBC WITH DIFFERENTIAL/PLATELET
Basophils Absolute: 0 K/uL (ref 0.0–0.1)
Basophils Relative: 0.3 % (ref 0.0–3.0)
Eosinophils Absolute: 0.4 K/uL (ref 0.0–0.7)
Eosinophils Relative: 4.6 % (ref 0.0–5.0)
HCT: 37 % (ref 36.0–46.0)
Hemoglobin: 12.3 g/dL (ref 12.0–15.0)
Lymphocytes Relative: 28.6 % (ref 12.0–46.0)
Lymphs Abs: 2.6 K/uL (ref 0.7–4.0)
MCHC: 33.3 g/dL (ref 30.0–36.0)
MCV: 78.7 fl (ref 78.0–100.0)
Monocytes Absolute: 0.4 K/uL (ref 0.1–1.0)
Monocytes Relative: 4.2 % (ref 3.0–12.0)
Neutro Abs: 5.6 K/uL (ref 1.4–7.7)
Neutrophils Relative %: 62.3 % (ref 43.0–77.0)
Platelets: 282 K/uL (ref 150.0–400.0)
RBC: 4.7 Mil/uL (ref 3.87–5.11)
RDW: 14.1 % (ref 11.5–15.5)
WBC: 9 K/uL (ref 4.0–10.5)

## 2024-07-27 LAB — TSH: TSH: 2.1 u[IU]/mL (ref 0.35–5.50)

## 2024-07-27 LAB — HEMOGLOBIN A1C: Hgb A1c MFr Bld: 7.1 % — ABNORMAL HIGH (ref 4.6–6.5)

## 2024-07-27 LAB — BASIC METABOLIC PANEL WITH GFR
BUN: 7 mg/dL (ref 6–23)
CO2: 28 meq/L (ref 19–32)
Calcium: 8.9 mg/dL (ref 8.4–10.5)
Chloride: 101 meq/L (ref 96–112)
Creatinine, Ser: 0.4 mg/dL (ref 0.40–1.20)
GFR: 129.43 mL/min
Glucose, Bld: 118 mg/dL — ABNORMAL HIGH (ref 70–99)
Potassium: 3.7 meq/L (ref 3.5–5.1)
Sodium: 137 meq/L (ref 135–145)

## 2024-07-27 LAB — LIPID PANEL
Cholesterol: 171 mg/dL (ref 28–200)
HDL: 56.8 mg/dL
LDL Cholesterol: 92 mg/dL (ref 10–99)
NonHDL: 114.03
Total CHOL/HDL Ratio: 3
Triglycerides: 112 mg/dL (ref 10.0–149.0)
VLDL: 22.4 mg/dL (ref 0.0–40.0)

## 2024-07-27 LAB — HEPATIC FUNCTION PANEL
ALT: 25 U/L (ref 3–35)
AST: 20 U/L (ref 5–37)
Albumin: 4.3 g/dL (ref 3.5–5.2)
Alkaline Phosphatase: 82 U/L (ref 39–117)
Bilirubin, Direct: 0.1 mg/dL (ref 0.1–0.3)
Total Bilirubin: 0.5 mg/dL (ref 0.2–1.2)
Total Protein: 7.2 g/dL (ref 6.0–8.3)

## 2024-07-27 NOTE — Assessment & Plan Note (Signed)
 Chronic problem.  Excellent control today on Enalapril  10mg .  Asymptomatic.  Check labs due to ACE use but no anticipated med changes.

## 2024-07-27 NOTE — Assessment & Plan Note (Signed)
 Chronic problem.  On Pravastatin 40mg  daily w/o difficulty.  Check labs.  Adjust meds prn

## 2024-07-27 NOTE — Patient Instructions (Signed)
 Follow up in 3-4 months to recheck sugar We'll notify you of your lab results and make any changes if needed Continue to work on healthy diet and regular exercise- you're doing great! Call with any questions or concerns Stay Safe!  Stay Healthy! Happy New Year!

## 2024-07-27 NOTE — Progress Notes (Signed)
" ° °  Subjective:    Patient ID: Gabrielle Edwards, female    DOB: 02-Oct-1990, 33 y.o.   MRN: [id number]  HPI DM- chronic problem, on Metformin  XR 500mg  daily and Mounjaro  7.5mg  weekly.  Down 4 lbs since last visit.  Pt reports 'feeling great'.  UTD on eye exam, foot exam, microalbumin.  No numbness/tingling of hands/feet.  HTN- chronic problem, on Enalapril  10mg  daily w/ good control.  No CP, SOB, HA's, visual changes, edema.  Hyperlipidemia- chronic problem, on Pravastatin  40mg  daily.  No abd pain, N/V.   Review of Systems For ROS see HPI     Objective:   Physical Exam Vitals reviewed.  Constitutional:      General: She is not in acute distress.    Appearance: Normal appearance. She is well-developed. She is obese. She is not ill-appearing.  HENT:     Head: Normocephalic and atraumatic.  Eyes:     Conjunctiva/sclera: Conjunctivae normal.     Pupils: Pupils are equal, round, and reactive to light.  Neck:     Thyroid : No thyromegaly.  Cardiovascular:     Rate and Rhythm: Normal rate and regular rhythm.     Pulses: Normal pulses.     Heart sounds: Normal heart sounds. No murmur heard. Pulmonary:     Effort: Pulmonary effort is normal. No respiratory distress.     Breath sounds: Normal breath sounds.  Abdominal:     General: There is no distension.     Palpations: Abdomen is soft.     Tenderness: There is no abdominal tenderness.  Musculoskeletal:     Cervical back: Normal range of motion and neck supple.     Right lower leg: No edema.     Left lower leg: No edema.  Lymphadenopathy:     Cervical: No cervical adenopathy.  Skin:    General: Skin is warm and dry.  Neurological:     General: No focal deficit present.     Mental Status: She is alert and oriented to person, place, and time.  Psychiatric:        Mood and Affect: Mood normal.        Behavior: Behavior normal.        Thought Content: Thought content normal.           Assessment & Plan:    "

## 2024-07-27 NOTE — Assessment & Plan Note (Signed)
 Chronic problem.  Currently on Metformin  XR and Mounjaro  w/o difficulty.  Down 4 lbs since last visit.  UTD on eye exam, foot exam, microalbumin.  Pt reports feeling great on current medications.  Check labs.  Adjust meds prn

## 2024-07-28 ENCOUNTER — Ambulatory Visit: Payer: Self-pay | Admitting: Family Medicine

## 2024-08-08 ENCOUNTER — Other Ambulatory Visit: Payer: Self-pay | Admitting: Family Medicine

## 2024-08-09 NOTE — Telephone Encounter (Unsigned)
 Copied from CRM #[id number]. Topic: Clinical - Medication Question >> Aug 09, 2024 10:42 AM Adelita E wrote: Reason for CRM: FYI - Patient calling in regarding a refill request of her Vitamin D , Ergocalciferol , (DRISDOL ) 1.25 MG (50000 UNIT) CAPS capsule. Relayed to patient this was refused as she will need lab work for future refills. Patient stated when she has her appointment in April she will request labs at that time.

## 2024-11-02 ENCOUNTER — Ambulatory Visit: Admitting: Family Medicine
